# Patient Record
Sex: Male | Born: 1995
Health system: Southern US, Community
[De-identification: ages and names within clinical notes are randomized; demographics above are authoritative.]

## PROBLEM LIST (undated history)

## (undated) DIAGNOSIS — F419 Anxiety disorder, unspecified: Secondary | ICD-10-CM

## (undated) DIAGNOSIS — C4491 Basal cell carcinoma of skin, unspecified: Secondary | ICD-10-CM

## (undated) DIAGNOSIS — F32A Depression, unspecified: Secondary | ICD-10-CM

## (undated) DIAGNOSIS — F329 Major depressive disorder, single episode, unspecified: Secondary | ICD-10-CM

## (undated) DIAGNOSIS — I2699 Other pulmonary embolism without acute cor pulmonale: Secondary | ICD-10-CM

## (undated) HISTORY — DX: Anxiety disorder, unspecified: F41.9

## (undated) HISTORY — DX: Depression, unspecified: F32.A

## (undated) HISTORY — DX: Major depressive disorder, single episode, unspecified: F32.9

## (undated) HISTORY — DX: Other pulmonary embolism without acute cor pulmonale: I26.99

## (undated) HISTORY — DX: Basal cell carcinoma of skin, unspecified: C44.91

---
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
## 2016-08-21 ENCOUNTER — Encounter: Payer: Self-pay | Admitting: Primary Care

## 2016-08-21 ENCOUNTER — Encounter (INDEPENDENT_AMBULATORY_CARE_PROVIDER_SITE_OTHER): Payer: Self-pay

## 2016-08-21 ENCOUNTER — Ambulatory Visit (INDEPENDENT_AMBULATORY_CARE_PROVIDER_SITE_OTHER): Payer: Self-pay | Admitting: Primary Care

## 2016-08-21 DIAGNOSIS — F329 Major depressive disorder, single episode, unspecified: Secondary | ICD-10-CM

## 2016-08-21 DIAGNOSIS — F32A Depression, unspecified: Secondary | ICD-10-CM | POA: Insufficient documentation

## 2016-08-21 DIAGNOSIS — F419 Anxiety disorder, unspecified: Secondary | ICD-10-CM

## 2016-08-21 NOTE — Assessment & Plan Note (Signed)
Currently managed on Citalopram 30 mg. Feels well managed. Continue current regimen. Denies SI/HI.

## 2016-08-21 NOTE — Progress Notes (Signed)
   Subjective:    Patient ID: Mike Rodriguez, male    DOB: 10/09/1995, 21 y.o.   MRN: [id number]  HPI  Mr. Mike Rodriguez is a 21 year old male who presents today to establish care and discuss the problems mentioned below. Will obtain old records.  1) Anxiety and Depression: Diagnosed years ago. Currently managed on citalopram 30 mg for which he's taken for years. Overall he feels well on this medication. He denies SI/HI, panic attacks. He does not need refills of this medication.  Review of Systems  Respiratory: Negative for shortness of breath.   Cardiovascular: Negative for chest pain.  Neurological: Negative for dizziness and headaches.  Psychiatric/Behavioral: Negative for suicidal ideas. The patient is not nervous/anxious.        Past Medical History:  Diagnosis Date  . Anxiety and depression      Social History   Social History  . Marital status: Single    Spouse name: N/A  . Number of children: N/A  . Years of education: N/A   Occupational History  . Not on file.   Social History Main Topics  . Smoking status: Never Smoker  . Smokeless tobacco: Never Used  . Alcohol use Not on file  . Drug use: Unknown  . Sexual activity: Not on file   Other Topics Concern  . Not on file   Social History Narrative   Single.   Currently not employed.   Enjoys playing computer games.     Past Surgical History:  Procedure Laterality Date  . APPENDECTOMY  2006    Family History  Problem Relation Age of Onset  . Hypertension Mother   . Kidney disease Mother   . Arthritis Maternal Grandmother   . Hyperlipidemia Maternal Grandmother   . Hypertension Maternal Grandmother     No Known Allergies  No current outpatient prescriptions on file prior to visit.   No current facility-administered medications on file prior to visit.     BP 122/80 (BP Location: Left Arm, Patient Position: Sitting, Cuff Size: Normal)   Pulse 80   Temp 98.4 F (36.9 C) (Oral)   Ht 5\' 10"  (1.778 m)    Wt 155 lb (70.3 kg)   SpO2 97%   BMI 22.24 kg/m    Objective:   Physical Exam  Constitutional: He is oriented to person, place, and time. He appears well-nourished.  Neck: Neck supple.  Cardiovascular: Normal rate and regular rhythm.   Pulmonary/Chest: Effort normal and breath sounds normal. He has no wheezes. He has no rales.  Neurological: He is alert and oriented to person, place, and time.  Skin: Skin is warm and dry.  Old scarring from acne  Psychiatric: He has a normal mood and affect.          Assessment & Plan:

## 2016-08-21 NOTE — Patient Instructions (Signed)
Please message me when you need a refill of your citalopram medication.  It was a pleasure to see you today!

## 2016-08-21 NOTE — Progress Notes (Signed)
Pre visit review using our clinic review tool, if applicable. No additional management support is needed unless otherwise documented below in the visit note. 

## 2016-11-11 ENCOUNTER — Other Ambulatory Visit: Payer: Self-pay | Admitting: *Deleted

## 2016-11-11 DIAGNOSIS — F32A Depression, unspecified: Secondary | ICD-10-CM

## 2016-11-11 DIAGNOSIS — F419 Anxiety disorder, unspecified: Principal | ICD-10-CM

## 2016-11-11 DIAGNOSIS — F329 Major depressive disorder, single episode, unspecified: Secondary | ICD-10-CM

## 2016-11-11 MED ORDER — CITALOPRAM HYDROBROMIDE 20 MG PO TABS: 30.0000 mg | ORAL_TABLET | Freq: Every day | ORAL | 2 refills | Status: DC

## 2016-11-11 NOTE — Telephone Encounter (Signed)
Patient's dad left a voicemail stating that [facility] requested a refill on Citalopram 5 days ago and has not heard anything back. Mike Rodriguez stated that they are requesting that this be refilled for a 90 day supply.

## 2016-11-11 NOTE — Telephone Encounter (Signed)
Noted- refill sent to pharmacy

## 2016-11-12 NOTE — Telephone Encounter (Signed)
Message left for patient to return my call.  

## 2016-11-13 NOTE — Telephone Encounter (Signed)
Message left for patient to return my call.  

## 2016-11-19 ENCOUNTER — Other Ambulatory Visit: Payer: Self-pay

## 2016-11-19 DIAGNOSIS — F32A Depression, unspecified: Secondary | ICD-10-CM

## 2016-11-19 DIAGNOSIS — F419 Anxiety disorder, unspecified: Principal | ICD-10-CM

## 2016-11-19 DIAGNOSIS — F329 Major depressive disorder, single episode, unspecified: Secondary | ICD-10-CM

## 2016-11-19 MED ORDER — CITALOPRAM HYDROBROMIDE 20 MG PO TABS: 30.0000 mg | ORAL_TABLET | Freq: Every day | ORAL | 2 refills | Status: DC

## 2016-11-19 NOTE — Telephone Encounter (Signed)
[email or web address]; [place]ital[place] to [place] and pt did not get med. [email or web address]. pts father voiced understanding.

## 2017-01-20 ENCOUNTER — Emergency Department
Admission: EM | Admit: 2017-01-20 | Discharge: 2017-01-20 | Disposition: A | Discharge status: Home / Self Care | Payer: BLUE CROSS/BLUE SHIELD | Attending: Emergency Medicine | Admitting: Emergency Medicine

## 2017-01-20 ENCOUNTER — Emergency Department: Payer: BLUE CROSS/BLUE SHIELD

## 2017-01-20 ENCOUNTER — Encounter: Payer: Self-pay | Admitting: Emergency Medicine

## 2017-01-20 DIAGNOSIS — Z79899 Other long term (current) drug therapy: Secondary | ICD-10-CM | POA: Insufficient documentation

## 2017-01-20 DIAGNOSIS — R109 Unspecified abdominal pain: Secondary | ICD-10-CM | POA: Diagnosis not present

## 2017-01-20 DIAGNOSIS — R1032 Left lower quadrant pain: Secondary | ICD-10-CM | POA: Diagnosis not present

## 2017-01-20 DIAGNOSIS — N201 Calculus of ureter: Secondary | ICD-10-CM

## 2017-01-20 LAB — URINALYSIS, COMPLETE (UACMP) WITH MICROSCOPIC
BACTERIA UA: NONE SEEN
BILIRUBIN URINE: NEGATIVE
GLUCOSE, UA: NEGATIVE mg/dL
KETONES UR: NEGATIVE mg/dL
Leukocytes, UA: NEGATIVE
NITRITE: NEGATIVE
PH: 8 (ref 5.0–8.0)
PROTEIN: 100 mg/dL — AB
Specific Gravity, Urine: 1.023 (ref 1.005–1.030)
Squamous Epithelial / LPF: NONE SEEN

## 2017-01-20 LAB — CBC
HCT: 44.1 % (ref 40.0–52.0)
Hemoglobin: 15.2 g/dL (ref 13.0–18.0)
MCH: 32.1 pg (ref 26.0–34.0)
MCHC: 34.6 g/dL (ref 32.0–36.0)
MCV: 93 fL (ref 80.0–100.0)
PLATELETS: 272 10*3/uL (ref 150–440)
RBC: 4.74 MIL/uL (ref 4.40–5.90)
RDW: 12.7 % (ref 11.5–14.5)
WBC: 7.7 10*3/uL (ref 3.8–10.6)

## 2017-01-20 LAB — COMPREHENSIVE METABOLIC PANEL
ALBUMIN: 4.6 g/dL (ref 3.5–5.0)
ALT: 33 U/L (ref 17–63)
ANION GAP: 12 (ref 5–15)
AST: 35 U/L (ref 15–41)
Alkaline Phosphatase: 60 U/L (ref 38–126)
BUN: 14 mg/dL (ref 6–20)
CALCIUM: 9.8 mg/dL (ref 8.9–10.3)
CHLORIDE: 104 mmol/L (ref 101–111)
CO2: 26 mmol/L (ref 22–32)
Creatinine, Ser: 1.26 mg/dL — ABNORMAL HIGH (ref 0.61–1.24)
GFR calc Af Amer: >60 mL/min (ref >60)
GFR calc non Af Amer: >60 mL/min (ref >60)
Glucose, Bld: 115 mg/dL — ABNORMAL HIGH (ref 65–99)
POTASSIUM: 3.2 mmol/L — AB (ref 3.5–5.1)
SODIUM: 142 mmol/L (ref 135–145)
Total Bilirubin: 0.9 mg/dL (ref 0.3–1.2)
Total Protein: 7.8 g/dL (ref 6.5–8.1)

## 2017-01-20 LAB — LIPASE, BLOOD: Lipase: 31 U/L (ref 11–51)

## 2017-01-20 LAB — CHLAMYDIA/NGC RT PCR (ARMC ONLY)
CHLAMYDIA TR: NOT DETECTED
N GONORRHOEAE: NOT DETECTED

## 2017-01-20 MED ORDER — KETOROLAC TROMETHAMINE 30 MG/ML IJ SOLN
30.0000 mg | Freq: Once | INTRAMUSCULAR | Status: AC
  Administered 2017-01-20: 30 mg via INTRAVENOUS

## 2017-01-20 MED ORDER — KETOROLAC TROMETHAMINE 30 MG/ML IJ SOLN
INTRAMUSCULAR | Status: AC
  Filled 2017-01-20: qty 1

## 2017-01-20 MED ORDER — TAMSULOSIN HCL 0.4 MG PO CAPS: 0.4000 mg | ORAL_CAPSULE | Freq: Every day | ORAL | 0 refills | Status: DC

## 2017-01-20 MED ORDER — NAPROXEN 500 MG PO TABS: 500.0000 mg | ORAL_TABLET | Freq: Two times a day (BID) | ORAL | 2 refills | Status: DC

## 2017-01-20 MED ORDER — SODIUM CHLORIDE 0.9 % IV SOLN
1000.0000 mL | Freq: Once | INTRAVENOUS | Status: AC
Start: 2017-01-20 — End: 2017-01-20
  Administered 2017-01-20: 1000 mL via INTRAVENOUS

## 2017-01-20 MED ORDER — ONDANSETRON 4 MG PO TBDP: 4.0000 mg | ORAL_TABLET | Freq: Three times a day (TID) | ORAL | 0 refills | Status: DC | PRN

## 2017-01-20 MED ORDER — TRAMADOL HCL 50 MG PO TABS: 50.0000 mg | ORAL_TABLET | Freq: Four times a day (QID) | ORAL | 0 refills | Status: DC | PRN

## 2017-01-20 NOTE — ED Notes (Signed)
Pt given water 

## 2017-01-20 NOTE — ED Triage Notes (Signed)
C/O "bladder hurting real bad".  Also c/o left blank pain and small amount of blood in urine.  Symptoms started yesterday.  Denies vomitng but symptoms include occasional vomiting.

## 2017-01-20 NOTE — ED Notes (Signed)
Patient transported to CT 

## 2017-01-21 NOTE — ED Provider Notes (Signed)
[facility] Emergency Department Provider Note   ____________________________________________    I have reviewed the triage vital signs and the nursing notes.   HISTORY  Chief Complaint Flank Pain     HPI Mike Rodriguez is a 21 y.o. male who presents with complaints of left-sided flank pain radiating into his groin. He has never had this before. He reports the pain as intermittently sharp and severe and then becomes dull and mild. This started earlier this morning. No history of kidney stones. No penile discharge. No dysuria. No frequency. Positive hematuria. No fevers or chills.   Past Medical History:  Diagnosis Date  . Anxiety and depression     Patient Active Problem List   Diagnosis Date Noted  . Anxiety and depression 08/21/2016    Past Surgical History:  Procedure Laterality Date  . APPENDECTOMY  2006    Prior to Admission medications   Medication Sig Start Date End Date Taking? Authorizing Provider  acetaminophen (TYLENOL) 325 MG tablet Take 650 mg by mouth every 6 (six) hours as needed for headache.    [provider]  citalopram (CELEXA) 20 MG tablet Take 1.5 tablets (30 mg total) by mouth daily. 11/19/16   Doreene Nest, NP  diphenhydrAMINE (BENADRYL) 25 MG tablet Take 25 mg by mouth every 6 (six) hours as needed for allergies.    [provider]  Multiple Vitamin (MULTIVITAMIN) tablet Take 1 tablet by mouth daily.    [provider]  naproxen (NAPROSYN) 500 MG tablet Take 1 tablet (500 mg total) by mouth 2 (two) times daily with a meal. 01/20/17   Jene Every, MD  ondansetron (ZOFRAN ODT) 4 MG disintegrating tablet Take 1 tablet (4 mg total) by mouth every 8 (eight) hours as needed for nausea or vomiting. 01/20/17   Jene Every, MD  tamsulosin (FLOMAX) 0.4 MG CAPS capsule Take 1 capsule (0.4 mg total) by mouth daily. 01/20/17   Jene Every, MD  traMADol (ULTRAM) 50 MG tablet Take 1 tablet (50 mg  total) by mouth every 6 (six) hours as needed. 01/20/17 01/20/18  Jene Every, MD     Allergies Patient has no known allergies.  Family History  Problem Relation Age of Onset  . Hypertension Mother   . Kidney disease Mother   . Arthritis Maternal Grandmother   . Hyperlipidemia Maternal Grandmother   . Hypertension Maternal Grandmother     Social History Social History  Substance Use Topics  . Smoking status: Never Smoker  . Smokeless tobacco: Never Used  . Alcohol use Not on file    Review of Systems  Constitutional: No fever/chills Eyes: No visual changes.  ENT: No sore throat. Cardiovascular: Denies chest pain. Respiratory: Denies shortness of breath. Gastrointestinal: Flank pain as above Genitourinary:As above Musculoskeletal: Negative for back pain. Skin: Negative for rash. Neurological: Negative for headaches    ____________________________________________   PHYSICAL EXAM:  VITAL SIGNS: ED Triage Vitals  Enc Vitals Group     BP 01/20/17 1718 134/85     Pulse Rate 01/20/17 1718 (!) 102     Resp 01/20/17 1718 16     Temp 01/20/17 1718 98.3 F (36.8 C)     Temp Source 01/20/17 1718 Oral     SpO2 01/20/17 1718 100 %     Weight 01/20/17 1717 68 kg (150 lb)     Height 01/20/17 1717 1.778 m ( )     Head Circumference --      Peak Flow --  Pain Score 01/20/17 1716 8     Pain Loc --      Pain Edu? --      Excl. in GC? --     Constitutional: Alert and oriented. No acute distress. Pleasant and interactive Eyes: Conjunctivae are normal.  Head: Atraumatic. Nose: No congestion/rhinnorhea. Mouth/Throat: Mucous membranes are moist.   Cardiovascular: Normal rate, regular rhythm. Peri Jefferson peripheral circulation. Respiratory: Normal respiratory effort.   Gastrointestinal: Soft and nontender. No distention.  No CVA tenderness. Genitourinary: deferred Musculoskeletal:   Warm and well perfused Neurologic:  Normal speech and language. No gross focal  neurologic deficits are appreciated.  Skin:  Skin is warm, dry and intact. No rash noted. Psychiatric: Mood and affect are normal. Speech and behavior are normal.  ____________________________________________   LABS (all labs ordered are listed, but only abnormal results are displayed)  Labs Reviewed  URINALYSIS, COMPLETE (UACMP) WITH MICROSCOPIC - Abnormal; Notable for the following:       Result Value   Color, Urine YELLOW (*)    APPearance TURBID (*)    Hgb urine dipstick LARGE (*)    Protein, ur 100 (*)    All other components within normal limits  COMPREHENSIVE METABOLIC PANEL - Abnormal; Notable for the following:    Potassium 3.2 (*)    Glucose, Bld 115 (*)    Creatinine, Ser 1.26 (*)    All other components within normal limits  CHLAMYDIA/NGC RT PCR ([facility] ONLY)  CBC  LIPASE, BLOOD   ____________________________________________  EKG  None ____________________________________________  RADIOLOGY CT renal stone study demonstrates left-sided ureterolithiasis  ____________________________________________   PROCEDURES  Procedure(s) performed: No    Critical Care performed: No ____________________________________________   INITIAL IMPRESSION / ASSESSMENT AND PLAN / ED COURSE  Pertinent labs & imaging results that were available during my care of the patient were reviewed by me and considered in my medical decision making (see chart for details).  Differential diagnosis includes ureterolithiasis, UTI, STD  CT renal stone study is consistent with kidney stone. Patient had complete resolution of pain after IM Toradol. I'll discharge with pain medications, Zofran and Flomax and have him follow-up with urology.    ____________________________________________   FINAL CLINICAL IMPRESSION(S) / ED DIAGNOSES  Final diagnoses:  Ureterolithiasis      NEW MEDICATIONS STARTED DURING THIS VISIT:  Discharge Medication List as of 01/20/2017  6:22 PM      START taking these medications   Details  naproxen (NAPROSYN) 500 MG tablet Take 1 tablet (500 mg total) by mouth 2 (two) times daily with a meal., Starting Mon 01/20/2017, Print    ondansetron (ZOFRAN ODT) 4 MG disintegrating tablet Take 1 tablet (4 mg total) by mouth every 8 (eight) hours as needed for nausea or vomiting., Starting Mon 01/20/2017, Print    tamsulosin (FLOMAX) 0.4 MG CAPS capsule Take 1 capsule (0.4 mg total) by mouth daily., Starting Mon 01/20/2017, Print    traMADol (ULTRAM) 50 MG tablet Take 1 tablet (50 mg total) by mouth every 6 (six) hours as needed., Starting Mon 01/20/2017, Until Tue 01/20/2018, Print         Note:  This document was prepared using Dragon voice recognition software and may include unintentional dictation errors.    Jene Every, MD 01/21/17 [phone]

## 2017-07-04 ENCOUNTER — Telehealth: Payer: Self-pay | Admitting: Primary Care

## 2017-07-04 DIAGNOSIS — F419 Anxiety disorder, unspecified: Principal | ICD-10-CM

## 2017-07-04 DIAGNOSIS — F32A Depression, unspecified: Secondary | ICD-10-CM

## 2017-07-04 DIAGNOSIS — F329 Major depressive disorder, single episode, unspecified: Secondary | ICD-10-CM

## 2017-07-04 MED ORDER — CITALOPRAM HYDROBROMIDE 20 MG PO TABS: 30.0000 mg | ORAL_TABLET | Freq: Every day | ORAL | 2 refills | Status: DC

## 2017-07-04 NOTE — Telephone Encounter (Signed)
Copied from CRM [id number]. Topic: Quick Communication - Rx Refill/Question >> Jul 04, 2017 12:31 PM Raquel SarnaHayes, Teresa G wrote: citalopram (CELEXA) 20 MG tablet  Pt has no refills - pt is needing refills [facility] - [place] - [address] [place] [id number] Phone: [phone] Fax: [phone]

## 2017-08-18 ENCOUNTER — Telehealth: Payer: Self-pay | Admitting: Primary Care

## 2017-08-18 DIAGNOSIS — F32A Depression, unspecified: Secondary | ICD-10-CM

## 2017-08-18 DIAGNOSIS — F329 Major depressive disorder, single episode, unspecified: Secondary | ICD-10-CM

## 2017-08-18 DIAGNOSIS — F419 Anxiety disorder, unspecified: Principal | ICD-10-CM

## 2017-08-18 NOTE — Telephone Encounter (Signed)
Copied from CRM [id number]. Topic: Quick Communication - Rx Refill/Question >> Aug 18, 2017  3:33 PM Eston Mould B wrote: Medication: citalopram (CELEXA) 20 MG tablet    Has the patient contacted their pharmacy? No   (Agent: If no, request that the patient contact the pharmacy for the refill.)  Preferred Pharmacy (with phone number or street name): [facility] - [place], [place] - [address] [phone] (Phone) [phone] (Fax)      Agent: Please be advised that RX refills may take up to 3 business days. We ask that you follow-up with your pharmacy.

## 2017-08-19 MED ORDER — CITALOPRAM HYDROBROMIDE 20 MG PO TABS: 30.0000 mg | ORAL_TABLET | Freq: Every day | ORAL | 0 refills | Status: DC

## 2017-08-19 NOTE — Telephone Encounter (Signed)
30 day refill of medication refill given until pt seen in office for appt on 07/04/17.   LOV: 08/21/16 Next OV: 09/17/17

## 2017-09-01 ENCOUNTER — Other Ambulatory Visit: Payer: Self-pay | Admitting: Primary Care

## 2017-09-01 DIAGNOSIS — Z0000 Encounter for general adult medical examination without abnormal findings: Secondary | ICD-10-CM

## 2017-09-04 ENCOUNTER — Other Ambulatory Visit: Payer: Self-pay | Admitting: Primary Care

## 2017-09-04 DIAGNOSIS — F329 Major depressive disorder, single episode, unspecified: Secondary | ICD-10-CM

## 2017-09-04 DIAGNOSIS — F32A Depression, unspecified: Secondary | ICD-10-CM

## 2017-09-04 DIAGNOSIS — F419 Anxiety disorder, unspecified: Principal | ICD-10-CM

## 2017-09-04 MED ORDER — CITALOPRAM HYDROBROMIDE 20 MG PO TABS: 30.0000 mg | ORAL_TABLET | Freq: Every day | ORAL | 0 refills | Status: DC

## 2017-09-04 NOTE — Telephone Encounter (Signed)
Copied from CRM [id number]. Topic: Quick Communication - Rx Refill/Question >> Sep 04, 2017  2:51 PM Laural Benes, [place] C wrote: Medication: citalopram (CELEXA) 20 MG tablet  Has the patient contacted their pharmacy? Yes   (Agent: If no, request that the patient contact the pharmacy for the refill.) (Agent: If yes, when and what did the pharmacy advise?)  Preferred Pharmacy (with phone number or street name): [facility] - [place], [place] - [address] [phone] (Phone) [phone] (Fax)      Agent: Please be advised that RX refills may take up to 3 business days. We ask that you follow-up with your pharmacy.

## 2017-09-12 ENCOUNTER — Other Ambulatory Visit (INDEPENDENT_AMBULATORY_CARE_PROVIDER_SITE_OTHER): Payer: BLUE CROSS/BLUE SHIELD

## 2017-09-12 DIAGNOSIS — Z0000 Encounter for general adult medical examination without abnormal findings: Secondary | ICD-10-CM

## 2017-09-12 LAB — COMPREHENSIVE METABOLIC PANEL
ALT: 14 U/L (ref 0–53)
AST: 17 U/L (ref 0–37)
Albumin: 4.7 g/dL (ref 3.5–5.2)
Alkaline Phosphatase: 61 U/L (ref 39–117)
BUN: 15 mg/dL (ref 6–23)
CHLORIDE: 103 meq/L (ref 96–112)
CO2: 30 meq/L (ref 19–32)
Calcium: 9.8 mg/dL (ref 8.4–10.5)
Creatinine, Ser: 1.05 mg/dL (ref 0.40–1.50)
GFR: 92.3 mL/min (ref >60.00)
GLUCOSE: 101 mg/dL — AB (ref 70–99)
POTASSIUM: 3.9 meq/L (ref 3.5–5.1)
Sodium: 141 mEq/L (ref 135–145)
Total Bilirubin: 0.7 mg/dL (ref 0.2–1.2)
Total Protein: 7.7 g/dL (ref 6.0–8.3)

## 2017-09-12 LAB — LIPID PANEL
CHOL/HDL RATIO: 5
Cholesterol: 178 mg/dL (ref 0–200)
HDL: 37.2 mg/dL — AB (ref >39.00)
LDL CALC: 122 mg/dL — AB (ref 0–99)
NonHDL: 140.76
Triglycerides: 92 mg/dL (ref 0.0–149.0)
VLDL: 18.4 mg/dL (ref 0.0–40.0)

## 2017-09-17 ENCOUNTER — Encounter: Payer: Self-pay | Admitting: Primary Care

## 2017-09-17 ENCOUNTER — Ambulatory Visit (INDEPENDENT_AMBULATORY_CARE_PROVIDER_SITE_OTHER): Payer: BLUE CROSS/BLUE SHIELD | Admitting: Primary Care

## 2017-09-17 VITALS — BP 122/82 | HR 102 | Temp 98.2°F | Ht 70.0 in | Wt 155.0 lb

## 2017-09-17 DIAGNOSIS — F419 Anxiety disorder, unspecified: Secondary | ICD-10-CM

## 2017-09-17 DIAGNOSIS — F32A Depression, unspecified: Secondary | ICD-10-CM

## 2017-09-17 DIAGNOSIS — R002 Palpitations: Secondary | ICD-10-CM | POA: Diagnosis not present

## 2017-09-17 DIAGNOSIS — Z0000 Encounter for general adult medical examination without abnormal findings: Secondary | ICD-10-CM | POA: Insufficient documentation

## 2017-09-17 DIAGNOSIS — Z0001 Encounter for general adult medical examination with abnormal findings: Secondary | ICD-10-CM

## 2017-09-17 DIAGNOSIS — F329 Major depressive disorder, single episode, unspecified: Secondary | ICD-10-CM | POA: Diagnosis not present

## 2017-09-17 HISTORY — DX: Palpitations: R00.2

## 2017-09-17 NOTE — Assessment & Plan Note (Signed)
Also with chest pain in various regions of the chest. ECG with NSR with rate of 89. No ST elevation, t-wave inversions, PAC/PVC.  Suspect more MSK, GERD, or anxiety as cause. He refuses lab draw for TSH and d-dimer today, despite recommendations. Doubt PE but given history of PE with symptoms of palpations and chest pain this seems reasonable.   Discussed use of Tums vs Zantac PRN. He will update if symptoms persist.

## 2017-09-17 NOTE — Assessment & Plan Note (Signed)
Declines tetanus vaccination. Poor diet and does not exercise.  Recommended regular exercise, start eating vegetables, fruit, whole grains, increase water.  Exam unremarkable.  Labs unremarkable.  Follow up in 1 year for CPE.

## 2017-09-17 NOTE — Assessment & Plan Note (Signed)
Feels well managed on Citalopram at current dose, although his father reports that he doesn't like to go outside of the house or sometimes his room. He is not employed.   Will continue Citalopram at current dose. Denies SI/HI. He will update if he feels symptoms become worse.

## 2017-09-17 NOTE — Progress Notes (Signed)
Subjective:    Patient ID: Mike Rodriguez, male    DOB: [birth year], 22 y.o.   MRN: [id number]  HPI  [name] is a 22 year old male who presents today for complete physical.  He does notice palpitations and chest pain intermittently.  His pain is located to various regions of his chest at different times, epigastric, left lateral chest, right lower chest. He denies pain with deep inspiration, he does have a history of pulmonary embolism during childhood. He plays a video game using a steering wheel that is heavy, plays this game daily. He can sometimes feel pain when he uses the steering wheel as he has to work hard to turn. He does endorse a lot of belching during some episodes of epigastric pain. He has taken Tums without improvement. He has a history of pectus excavatum.   Immunizations: -Tetanus: Unsure, believes it's over 10 years.  -Influenza: Did not complete last season   Diet: He endorsees a poor diet. Breakfast: Cinnamon toast with syrup Lunch: Sandwich, chips Dinner: Chicken, pasta, pizza, no vegetables Snacks: None Desserts: Cookies, once weekly Beverages: Some water, Soda, little liquid intake  Exercise: He does not exercise Eye exam: No recent exam Dental exam: Completed last year.   Review of Systems  Constitutional: Negative for unexpected weight change.  HENT: Negative for rhinorrhea.   Respiratory: Negative for cough and shortness of breath.   Cardiovascular: Negative for chest pain.  Gastrointestinal: Negative for constipation and diarrhea.  Genitourinary: Negative for difficulty urinating.  Musculoskeletal: Negative for arthralgias and myalgias.  Skin: Negative for rash.  Allergic/Immunologic: Negative for environmental allergies.  Neurological: Negative for dizziness and headaches.  Psychiatric/Behavioral:       Overall feels well managed on Citalopram       Past Medical History:  Diagnosis Date  . Anxiety and depression   . Pulmonary embolism  [facility])      Social History   Socioeconomic History  . Marital status: Single    Spouse name: Not on file  . Number of children: Not on file  . Years of education: Not on file  . Highest education level: Not on file  Occupational History  . Not on file  Social Needs  . Financial resource strain: Not on file  . Food insecurity:    Worry: Not on file    Inability: Not on file  . Transportation needs:    Medical: Not on file    Non-medical: Not on file  Tobacco Use  . Smoking status: Never Smoker  . Smokeless tobacco: Never Used  Substance and Sexual Activity  . Alcohol use: Not on file  . Drug use: Not on file  . Sexual activity: Not on file  Lifestyle  . Physical activity:    Days per week: Not on file    Minutes per session: Not on file  . Stress: Not on file  Relationships  . Social connections:    Talks on phone: Not on file    Gets together: Not on file    Attends religious service: Not on file    Active member of club or organization: Not on file    Attends meetings of clubs or organizations: Not on file    Relationship status: Not on file  . Intimate partner violence:    Fear of current or ex partner: Not on file    Emotionally abused: Not on file    Physically abused: Not on file    Forced sexual activity:  Not on file  Other Topics Concern  . Not on file  Social History Narrative   Single.   Currently not employed.   Enjoys playing computer games.     Past Surgical History:  Procedure Laterality Date  . APPENDECTOMY  2006    Family History  Problem Relation Age of Onset  . Hypertension Mother   . Kidney disease Mother   . Arthritis Maternal Grandmother   . Hyperlipidemia Maternal Grandmother   . Hypertension Maternal Grandmother     No Known Allergies  Current Outpatient Medications on File Prior to Visit  Medication Sig Dispense Refill  . citalopram (CELEXA) 20 MG tablet Take 1.5 tablets (30 mg total) by mouth daily. 45 tablet 0  .  diphenhydrAMINE (BENADRYL) 25 MG tablet Take 25 mg by mouth every 6 (six) hours as needed for allergies.    . Multiple Vitamin (MULTIVITAMIN) tablet Take 1 tablet by mouth daily.     No current facility-administered medications on file prior to visit.     BP 122/82   Pulse (!) 102   Temp 98.2 F (36.8 C) (Oral)   Ht 5\' 10"  (1.778 m)   Wt 155 lb (70.3 kg)   SpO2 98%   BMI 22.24 kg/m    Objective:   Physical Exam  Constitutional: He is oriented to person, place, and time. He appears well-nourished.  HENT:  Mouth/Throat: No oropharyngeal exudate.  Eyes: Pupils are equal, round, and reactive to light. EOM are normal.  Neck: Neck supple. No thyromegaly present.  Cardiovascular: Normal rate and regular rhythm.  Respiratory: Effort normal and breath sounds normal.  GI: Soft. Bowel sounds are normal. There is no tenderness.  Musculoskeletal: Normal range of motion.  Pectus excavatum  Neurological: He is alert and oriented to person, place, and time.  Skin: Skin is warm and dry.  Psychiatric: He has a normal mood and affect.           Assessment & Plan:

## 2017-09-17 NOTE — Patient Instructions (Signed)
Start exercising. You should be getting 150 minutes of moderate intensity exercise weekly.  It's important to improve your diet by reducing consumption of fast food, fried food, processed snack foods, sugary drinks. Increase consumption of fresh vegetables and fruits, whole grains, water.  Ensure you are drinking 64 ounces of water daily.  Please notify me if your depression and anxiety become worse.  Follow up in 1 year or sooner if needed.  It was a pleasure to see you today!   Preventive Care 18-39 Years, Male Preventive care refers to lifestyle choices and visits with your health care provider that can promote health and wellness. What does preventive care include?  A yearly physical exam. This is also called an annual well check.  Dental exams once or twice a year.  Routine eye exams. Ask your health care provider how often you should have your eyes checked.  Personal lifestyle choices, including: ? Daily care of your teeth and gums. ? Regular physical activity. ? Eating a healthy diet. ? Avoiding tobacco and drug use. ? Limiting alcohol use. ? Practicing safe sex. What happens during an annual well check? The services and screenings done by your health care provider during your annual well check will depend on your age, overall health, lifestyle risk factors, and family history of disease. Counseling Your health care provider may ask you questions about your:  Alcohol use.  Tobacco use.  Drug use.  Emotional well-being.  Home and relationship well-being.  Sexual activity.  Eating habits.  Work and work Statistician.  Screening You may have the following tests or measurements:  Height, weight, and BMI.  Blood pressure.  Lipid and cholesterol levels. These may be checked every 5 years starting at age 36.  Diabetes screening. This is done by checking your blood sugar (glucose) after you have not eaten for a while (fasting).  Skin check.  Hepatitis C  blood test.  Hepatitis B blood test.  Sexually transmitted disease (STD) testing.  Discuss your test results, treatment options, and if necessary, the need for more tests with your health care provider. Vaccines Your health care provider may recommend certain vaccines, such as:  Influenza vaccine. This is recommended every year.  Tetanus, diphtheria, and acellular pertussis (Tdap, Td) vaccine. You may need a Td booster every 10 years.  Varicella vaccine. You may need this if you have not been vaccinated.  HPV vaccine. If you are 42 or younger, you may need three doses over 6 months.  Measles, mumps, and rubella (MMR) vaccine. You may need at least one dose of MMR.You may also need a second dose.  Pneumococcal 13-valent conjugate (PCV13) vaccine. You may need this if you have certain conditions and have not been vaccinated.  Pneumococcal polysaccharide (PPSV23) vaccine. You may need one or two doses if you smoke cigarettes or if you have certain conditions.  Meningococcal vaccine. One dose is recommended if you are age 61-21 years and a first-year college student living in a residence hall, or if you have one of several medical conditions. You may also need additional booster doses.  Hepatitis A vaccine. You may need this if you have certain conditions or if you travel or work in places where you may be exposed to hepatitis A.  Hepatitis B vaccine. You may need this if you have certain conditions or if you travel or work in places where you may be exposed to hepatitis B.  Haemophilus influenzae type b (Hib) vaccine. You may need this if you  have certain risk factors.  Talk to your health care provider about which screenings and vaccines you need and how often you need them. This information is not intended to replace advice given to you by your health care provider. Make sure you discuss any questions you have with your health care provider. Document Released: 05/28/2001 Document  Revised: 12/20/2015 Document Reviewed: 01/31/2015 Elsevier Interactive Patient Education  [employer].

## 2017-09-30 ENCOUNTER — Telehealth: Payer: Self-pay | Admitting: Primary Care

## 2017-09-30 DIAGNOSIS — F419 Anxiety disorder, unspecified: Principal | ICD-10-CM

## 2017-09-30 DIAGNOSIS — F32A Depression, unspecified: Secondary | ICD-10-CM

## 2017-09-30 DIAGNOSIS — F329 Major depressive disorder, single episode, unspecified: Secondary | ICD-10-CM

## 2017-09-30 NOTE — Telephone Encounter (Signed)
Copied from CRM [id number]. Topic: Quick Communication - Rx Refill/Question >> Sep 30, 2017 12:01 PM Alexander BergeronBarksdale, Harvey B wrote: Father asked if the pill quantity can be upped so that the medication can last longer, call to advise  Medication: citalopram (CELEXA) 20 MG tablet [[id number]][[id number]]   Has the patient contacted their pharmacy? Yes.   (Agent: If no, request that the patient contact the pharmacy for the refill.) (Agent: If yes, when and what did the pharmacy advise?)  Preferred Pharmacy (with phone number or street name): [facility] [address]  Agent: Please be advised that RX refills may take up to 3 business days. We ask that you follow-up with your pharmacy.

## 2017-10-01 MED ORDER — CITALOPRAM HYDROBROMIDE 20 MG PO TABS: 30.0000 mg | ORAL_TABLET | Freq: Every day | ORAL | 2 refills | Status: DC

## 2017-10-01 NOTE — Telephone Encounter (Signed)
Request for refill on Citalopram 20 mg tablets; requesting refill with larger quantity than 30 day supply.   Last refill 09/04/17; # 45; no refills  Last OV: 09/17/17  PCP Mike Rodriguez  Pharmacy [facility] [address]  Refill placed for 90 day supply, per protocol.   Called and left voice message that 90 day supply will be ordered.

## 2017-12-08 ENCOUNTER — Ambulatory Visit (INDEPENDENT_AMBULATORY_CARE_PROVIDER_SITE_OTHER): Payer: BLUE CROSS/BLUE SHIELD | Admitting: Family Medicine

## 2017-12-08 ENCOUNTER — Encounter: Payer: Self-pay | Admitting: Family Medicine

## 2017-12-08 VITALS — BP 118/78 | HR 88 | Temp 97.6°F | Ht 70.0 in | Wt 154.5 lb

## 2017-12-08 DIAGNOSIS — H6122 Impacted cerumen, left ear: Secondary | ICD-10-CM

## 2017-12-08 DIAGNOSIS — H938X3 Other specified disorders of ear, bilateral: Secondary | ICD-10-CM

## 2017-12-08 NOTE — Patient Instructions (Signed)
Good to see you today  Continue to limit volume through headphones as well as amount of time spent using headphones  If not better in a couple of weeks, please let us know, we can refer you to the ear, nose and throat specialist

## 2017-12-08 NOTE — Progress Notes (Signed)
   Subjective:    Patient ID: Mike Rodriguez, male    DOB: 07/02/1995, 22 y.o.   MRN: [id number]  HPI This is a 22 yo male who presents today with bilateral ear pressure x 1 week. Seems to go back and forth between his ears. Hearing feels a little muffled intermittently. No recent fever, no nasal congestion, some post nasal drainage chronically, had a sore throat one morning recently that resolved. No cough, no headache. Does not use cotton swabs in ear canals. Listens to music/videos for long periods of time through headphones.   Past Medical History:  Diagnosis Date  . Anxiety and depression   . Pulmonary embolism [facility](HCC)    Past Surgical History:  Procedure Laterality Date  . APPENDECTOMY  2006   Family History  Problem Relation Age of Onset  . Hypertension Mother   . Kidney disease Mother   . Arthritis Maternal Grandmother   . Hyperlipidemia Maternal Grandmother   . Hypertension Maternal Grandmother    Social History   Tobacco Use  . Smoking status: Never Smoker  . Smokeless tobacco: Never Used  Substance Use Topics  . Alcohol use: Not on file  . Drug use: Not on file      Review of Systems Per HPI    Objective:   Physical Exam  Constitutional: He is oriented to person, place, and time. He appears well-developed and well-nourished. No distress.  HENT:  Head: Normocephalic and atraumatic.  Right Ear: Hearing, tympanic membrane and external ear normal.  Left Ear: Hearing, tympanic membrane and external ear normal.  Mouth/Throat: Oropharynx is clear and moist.  Gross hearing intact. Weber- no lateralization, Rinne AC>BC. Normal TM visualized, moderate amount cerumen in left canal.   Eyes: Conjunctivae are normal.  Cardiovascular: Normal rate.  Pulmonary/Chest: Effort normal.  Neurological: He is alert and oriented to person, place, and time.  Skin: Skin is warm and dry. He is not diaphoretic.  Psychiatric: He has a normal mood and affect. His behavior is normal.  Judgment and thought content normal.  Vitals reviewed.     BP 118/78 (BP Location: Right Arm, Patient Position: Sitting, Cuff Size: Normal)   Pulse 88   Temp 97.6 F (36.4 C) (Oral)   Ht 5\' 10"  (1.778 m)   Wt 154 lb 8 oz (70.1 kg)   SpO2 97%   BMI 22.17 kg/m   Left ear irrigated by CMA; patient tolerated without adverse effects.     Assessment & Plan:  1. Pressure sensation in both ears - discussed use of headphones, loud noise exposure and encouraged him to decrease amount of time wearing headphones as well as minimizing exposure to loud noises - RTC precautions reviewed  2. Excessive cerumen in ear canal, left - irrigated with good results - discussed ear hygiene    Olean Reeeborah Gessner, FNP-BC   [facility], [facility]  12/08/2017 5:31 PM

## 2018-06-14 ENCOUNTER — Other Ambulatory Visit: Payer: Self-pay | Admitting: Primary Care

## 2018-06-14 DIAGNOSIS — F419 Anxiety disorder, unspecified: Principal | ICD-10-CM

## 2018-06-14 DIAGNOSIS — F329 Major depressive disorder, single episode, unspecified: Secondary | ICD-10-CM

## 2018-06-14 DIAGNOSIS — F32A Depression, unspecified: Secondary | ICD-10-CM

## 2018-09-06 ENCOUNTER — Other Ambulatory Visit: Payer: Self-pay | Admitting: Primary Care

## 2018-09-06 DIAGNOSIS — F419 Anxiety disorder, unspecified: Secondary | ICD-10-CM

## 2018-09-06 DIAGNOSIS — F32A Depression, unspecified: Secondary | ICD-10-CM

## 2018-09-06 DIAGNOSIS — F329 Major depressive disorder, single episode, unspecified: Secondary | ICD-10-CM

## 2018-09-25 ENCOUNTER — Other Ambulatory Visit: Payer: Self-pay | Admitting: Primary Care

## 2018-09-25 DIAGNOSIS — Z0000 Encounter for general adult medical examination without abnormal findings: Secondary | ICD-10-CM

## 2018-09-29 ENCOUNTER — Other Ambulatory Visit (INDEPENDENT_AMBULATORY_CARE_PROVIDER_SITE_OTHER): Payer: BC Managed Care – PPO

## 2018-09-29 ENCOUNTER — Other Ambulatory Visit: Payer: Self-pay

## 2018-09-29 DIAGNOSIS — Z0000 Encounter for general adult medical examination without abnormal findings: Secondary | ICD-10-CM

## 2018-09-29 LAB — LIPID PANEL
Cholesterol: 165 mg/dL (ref 0–200)
HDL: 39 mg/dL — ABNORMAL LOW (ref >39.00)
LDL Cholesterol: 111 mg/dL — ABNORMAL HIGH (ref 0–99)
NonHDL: 126.47
Total CHOL/HDL Ratio: 4
Triglycerides: 77 mg/dL (ref 0.0–149.0)
VLDL: 15.4 mg/dL (ref 0.0–40.0)

## 2018-09-29 LAB — COMPREHENSIVE METABOLIC PANEL
ALT: 11 U/L (ref 0–53)
AST: 16 U/L (ref 0–37)
Albumin: 4.6 g/dL (ref 3.5–5.2)
Alkaline Phosphatase: 62 U/L (ref 39–117)
BUN: 14 mg/dL (ref 6–23)
CO2: 28 mEq/L (ref 19–32)
Calcium: 9.7 mg/dL (ref 8.4–10.5)
Chloride: 103 mEq/L (ref 96–112)
Creatinine, Ser: 0.99 mg/dL (ref 0.40–1.50)
GFR: 92.14 mL/min (ref >60.00)
Glucose, Bld: 100 mg/dL — ABNORMAL HIGH (ref 70–99)
Potassium: 3.7 mEq/L (ref 3.5–5.1)
Sodium: 141 mEq/L (ref 135–145)
Total Bilirubin: 0.6 mg/dL (ref 0.2–1.2)
Total Protein: 7.3 g/dL (ref 6.0–8.3)

## 2018-10-05 ENCOUNTER — Other Ambulatory Visit: Payer: Self-pay | Admitting: Primary Care

## 2018-10-05 DIAGNOSIS — F329 Major depressive disorder, single episode, unspecified: Secondary | ICD-10-CM

## 2018-10-05 DIAGNOSIS — F32A Depression, unspecified: Secondary | ICD-10-CM

## 2018-10-07 ENCOUNTER — Telehealth: Payer: Self-pay | Admitting: Primary Care

## 2018-10-07 ENCOUNTER — Ambulatory Visit (INDEPENDENT_AMBULATORY_CARE_PROVIDER_SITE_OTHER): Payer: BC Managed Care – PPO | Admitting: Primary Care

## 2018-10-07 ENCOUNTER — Encounter: Payer: Self-pay | Admitting: Primary Care

## 2018-10-07 VITALS — BP 116/85 | HR 75 | Temp 98.2°F | Wt 156.0 lb

## 2018-10-07 DIAGNOSIS — F32A Depression, unspecified: Secondary | ICD-10-CM

## 2018-10-07 DIAGNOSIS — Z0001 Encounter for general adult medical examination with abnormal findings: Secondary | ICD-10-CM

## 2018-10-07 DIAGNOSIS — F329 Major depressive disorder, single episode, unspecified: Secondary | ICD-10-CM | POA: Diagnosis not present

## 2018-10-07 DIAGNOSIS — R739 Hyperglycemia, unspecified: Secondary | ICD-10-CM

## 2018-10-07 DIAGNOSIS — Z0000 Encounter for general adult medical examination without abnormal findings: Secondary | ICD-10-CM

## 2018-10-07 DIAGNOSIS — F419 Anxiety disorder, unspecified: Secondary | ICD-10-CM | POA: Diagnosis not present

## 2018-10-07 MED ORDER — CITALOPRAM HYDROBROMIDE 40 MG PO TABS: 40.0000 mg | ORAL_TABLET | Freq: Every day | ORAL | 0 refills | Status: DC

## 2018-10-07 NOTE — Assessment & Plan Note (Signed)
Noted on fasting labs from 2019 and 2020. A1C pending.

## 2018-10-07 NOTE — Progress Notes (Signed)
Subjective:    Patient ID: Mike Rodriguez, male    DOB: 1995-09-02, 23 y.o.   MRN: [id number]  HPI  Virtual Visit via Video Note  I connected with Mike Rodriguez on 10/07/18 at  3:00 PM EDT by a video enabled telemedicine application and verified that I am speaking with the correct person using two identifiers.  Location: Patient: Home Provider: Office   I discussed the limitations of evaluation and management by telemedicine and the availability of in person appointments. The patient expressed understanding and agreed to proceed.  History of Present Illness:  Mike Rodriguez is a 23 year old male who presents today for complete physical. His parents are with him as well who would like to discuss his anxiety and depression.  His father is on with him today who endorses that he continues to want to stay indoors. His family cannot get him to leave the house as he feels anxious around others. He doesn't want to make friends, get a job, go out to [employer] He spends most of his time at home. He feels very content this way, and does feel as though his anxiety and depression has improved overall with citalopram.  His family wants him to increase the dose of his citalopram to see if this helps him to want to get out of the house. He is okay with this.   Immunizations: -Tetanus: Completed in 2013 -Influenza: Due this season  -HPV: Never completed, he agrees to complete  Diet: He endorses a fair diet. He eats sandwich, chips, chicken, pizza, hamburgers, tacos. Cookies several days weekly. He is drinking little liquid during the day, mostly soda. Little water.  Exercise: He does not exercise.   Eye exam: Completed years ago. Dental exam: No recent exam   Observations/Objective:  Alert and oriented. Appears well, not sickly. No distress. Speaking in complete sentences.  Assessment and Plan:  See problem based charting  Follow Up Instructions:  Call the main line to set up your lab  appointment and HPV vaccination series.   You will get your second HPV vaccination 2 months after the first. You will get your third HPV vaccination 6 months after the first.  Start exercising. You should be getting 150 minutes of moderate intensity exercise weekly.  It's important to improve your diet by reducing consumption of fast food, fried food, processed snack foods, sugary drinks. Increase consumption of fresh vegetables and fruits, whole grains, water.  Ensure you are drinking 64 ounces of water daily.  We've increased the dose of your citalopram to 40 mg. Please update me in one month.  It was a pleasure to see you today!    I discussed the assessment and treatment plan with the patient. The patient was provided an opportunity to ask questions and all were answered. The patient agreed with the plan and demonstrated an understanding of the instructions.   The patient was advised to call back or seek an in-person evaluation if the symptoms worsen or if the condition fails to improve as anticipated.     Pleas Koch, NP     Review of Systems  Constitutional: Negative for unexpected weight change.  HENT: Negative for rhinorrhea.   Respiratory: Negative for cough and shortness of breath.   Cardiovascular: Negative for chest pain.  Gastrointestinal: Negative for constipation and diarrhea.  Genitourinary: Negative for difficulty urinating.  Musculoskeletal: Negative for arthralgias and myalgias.  Skin: Negative for rash.  Allergic/Immunologic: Negative for environmental allergies.  Neurological: Negative for  dizziness and headaches.  Psychiatric/Behavioral: The patient is not nervous/anxious.        Past Medical History:  Diagnosis Date  . Anxiety and depression   . Pulmonary embolism [facility](HCC)      Social History   Socioeconomic History  . Marital status: Single    Spouse name: Not on file  . Number of children: Not on file  . Years of education: Not on  file  . Highest education level: Not on file  Occupational History  . Not on file  Social Needs  . Financial resource strain: Not on file  . Food insecurity    Worry: Not on file    Inability: Not on file  . Transportation needs    Medical: Not on file    Non-medical: Not on file  Tobacco Use  . Smoking status: Never Smoker  . Smokeless tobacco: Never Used  Substance and Sexual Activity  . Alcohol use: Not on file  . Drug use: Not on file  . Sexual activity: Not on file  Lifestyle  . Physical activity    Days per week: Not on file    Minutes per session: Not on file  . Stress: Not on file  Relationships  . Social Musicianconnections    Talks on phone: Not on file    Gets together: Not on file    Attends religious service: Not on file    Active member of club or organization: Not on file    Attends meetings of clubs or organizations: Not on file    Relationship status: Not on file  . Intimate partner violence    Fear of current or ex partner: Not on file    Emotionally abused: Not on file    Physically abused: Not on file    Forced sexual activity: Not on file  Other Topics Concern  . Not on file  Social History Narrative   Single.   Currently not employed.   Enjoys playing computer games.     Past Surgical History:  Procedure Laterality Date  . APPENDECTOMY  2006    Family History  Problem Relation Age of Onset  . Hypertension Mother   . Kidney disease Mother   . Arthritis Maternal Grandmother   . Hyperlipidemia Maternal Grandmother   . Hypertension Maternal Grandmother     No Known Allergies  Current Outpatient Medications on File Prior to Visit  Medication Sig Dispense Refill  . citalopram (CELEXA) 20 MG tablet Take 1 and 1/2 tablets daily by mouth NEED APPOINTMENT FOR ANY MORE REFILLS IN JUNE 135 tablet 0  . diphenhydrAMINE (BENADRYL) 25 MG tablet Take 25 mg by mouth every 6 (six) hours as needed for allergies.    . Multiple Vitamin (MULTIVITAMIN) tablet  Take 1 tablet by mouth daily.     No current facility-administered medications on file prior to visit.     BP 116/85   Pulse 75   Temp 98.2 F (36.8 C)   Wt 156 lb (70.8 kg)   SpO2 98%   BMI 22.38 kg/m    Objective:   Physical Exam  Constitutional: He is oriented to person, place, and time. He appears well-nourished.  Respiratory: Effort normal.  Musculoskeletal: Normal range of motion.  Neurological: He is alert and oriented to person, place, and time.  Psychiatric: He has a normal mood and affect.           Assessment & Plan:

## 2018-10-07 NOTE — Telephone Encounter (Signed)
Patient needs to be set up for HPV vaccinations, he will need three at 0, 2, and 6 months increments.  He needs POC A1C as well. Please schedule both.

## 2018-10-07 NOTE — Patient Instructions (Addendum)
Call the main line to set up your lab appointment and HPV vaccination series.   You will get your second HPV vaccination 2 months after the first. You will get your third HPV vaccination 6 months after the first.  Start exercising. You should be getting 150 minutes of moderate intensity exercise weekly.  It's important to improve your diet by reducing consumption of fast food, fried food, processed snack foods, sugary drinks. Increase consumption of fresh vegetables and fruits, whole grains, water.  Ensure you are drinking 64 ounces of water daily.  We've increased the dose of your citalopram to 40 mg. Please update me in one month.  It was a pleasure to see you today!

## 2018-10-07 NOTE — Assessment & Plan Note (Signed)
Overall seem to be under good control. He does have aspects of social anxiety disorder given his desire to stay home and away from public spaces.  Discussed that a 10 mg increase in citalopram may not significantly reduce public anxiety or help him to want to be social, but that it may help with breakthrough anxiety.   I recommended psychiatry evaluation for further insight into his behavior, the patient kindly declined. We will increase his citalopram to 40 mg as he's already taking 30 mg. He will update in one month. Denies SI/HI.

## 2018-10-07 NOTE — Telephone Encounter (Signed)
Patient's dad called to schedule patient HPV vaccine. He stated that he believed the patient is needing some type of blood test done but he wasn't sure.  He wanted me to verify if patient is just needing a nurse visit for the vaccine and is this does curbside?    Thanks!

## 2018-10-07 NOTE — Assessment & Plan Note (Signed)
Tetanus UTD, has never completed HPV series. He will get this set up.  Encouraged a healthier diet with regular exercise. Virtual exam unremarkable. Labs reviewed. Follow up in 1 year for CPE.

## 2018-10-14 ENCOUNTER — Other Ambulatory Visit: Payer: BC Managed Care – PPO

## 2018-10-14 ENCOUNTER — Ambulatory Visit (INDEPENDENT_AMBULATORY_CARE_PROVIDER_SITE_OTHER): Payer: BC Managed Care – PPO

## 2018-10-14 DIAGNOSIS — Z23 Encounter for immunization: Secondary | ICD-10-CM

## 2018-11-05 ENCOUNTER — Telehealth: Payer: Self-pay | Admitting: Primary Care

## 2018-11-05 NOTE — Telephone Encounter (Signed)
-----   Message from Pleas Koch, NP sent at 10/07/2018  8:19 PM EDT ----- Regarding: HPV Will you please check on patient? How's he doing since we increased the dose of his citalopram to 40 mg?

## 2018-11-06 NOTE — Telephone Encounter (Signed)
Message left for patient to return my call.  

## 2018-11-09 NOTE — Telephone Encounter (Signed)
Message left for patient to return my call.  

## 2018-11-11 NOTE — Telephone Encounter (Signed)
Message left for patient to return my call.  

## 2018-12-15 ENCOUNTER — Ambulatory Visit (INDEPENDENT_AMBULATORY_CARE_PROVIDER_SITE_OTHER): Payer: Self-pay

## 2018-12-15 DIAGNOSIS — Z23 Encounter for immunization: Secondary | ICD-10-CM

## 2019-01-01 ENCOUNTER — Other Ambulatory Visit: Payer: Self-pay | Admitting: Primary Care

## 2019-01-01 DIAGNOSIS — F32A Depression, unspecified: Secondary | ICD-10-CM

## 2019-01-01 DIAGNOSIS — F419 Anxiety disorder, unspecified: Secondary | ICD-10-CM

## 2019-01-01 DIAGNOSIS — F329 Major depressive disorder, single episode, unspecified: Secondary | ICD-10-CM

## 2019-02-03 ENCOUNTER — Other Ambulatory Visit: Payer: Self-pay | Admitting: Primary Care

## 2019-02-03 DIAGNOSIS — F32A Depression, unspecified: Secondary | ICD-10-CM

## 2019-02-03 DIAGNOSIS — F329 Major depressive disorder, single episode, unspecified: Secondary | ICD-10-CM

## 2019-02-03 DIAGNOSIS — F419 Anxiety disorder, unspecified: Secondary | ICD-10-CM

## 2019-04-20 ENCOUNTER — Ambulatory Visit (INDEPENDENT_AMBULATORY_CARE_PROVIDER_SITE_OTHER): Payer: 59

## 2019-04-20 ENCOUNTER — Other Ambulatory Visit: Payer: Self-pay

## 2019-04-20 DIAGNOSIS — Z23 Encounter for immunization: Secondary | ICD-10-CM

## 2019-05-12 IMAGING — CT CT RENAL STONE PROTOCOL
3 of 4 series · 10 of 46 positions shown, 17 images · non-contrast
Comparison: None.

CLINICAL DATA: Bladder pain.  Left flank pain.

EXAM:
CT ABDOMEN AND PELVIS WITHOUT CONTRAST
TECHNIQUE: Multidetector CT imaging of the abdomen and pelvis was performed
following the standard protocol without IV contrast.

[Series 4: lung bases · axial · 0.71mm/px · z∈[-217,-117]mm · 6 of 29 slices shown, 11 images]
[im 5/29  soft-tissue]
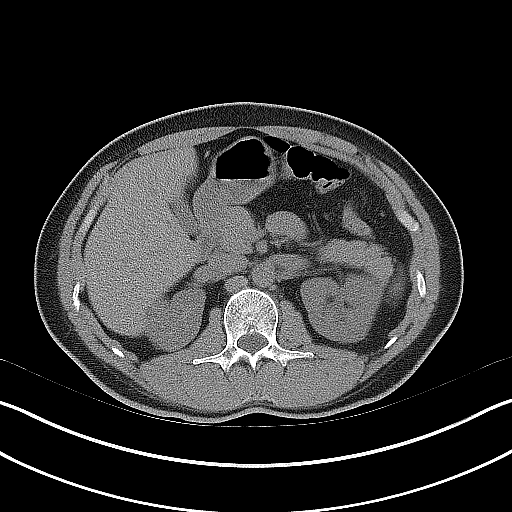
[im 5/29  bone]
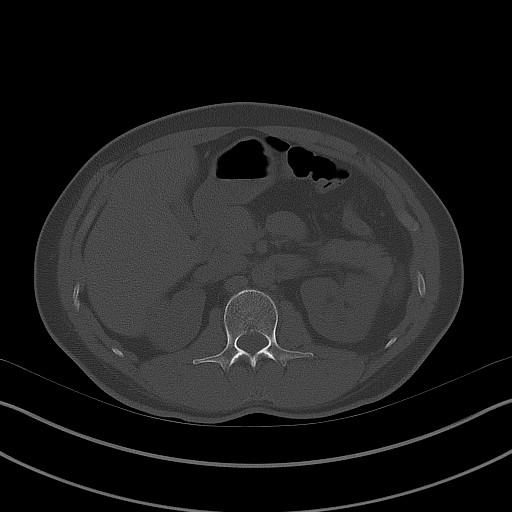
[im 9/29  soft-tissue]
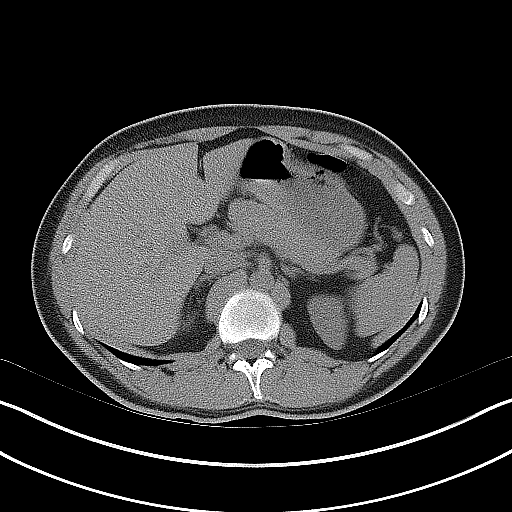
[im 13/29  soft-tissue]
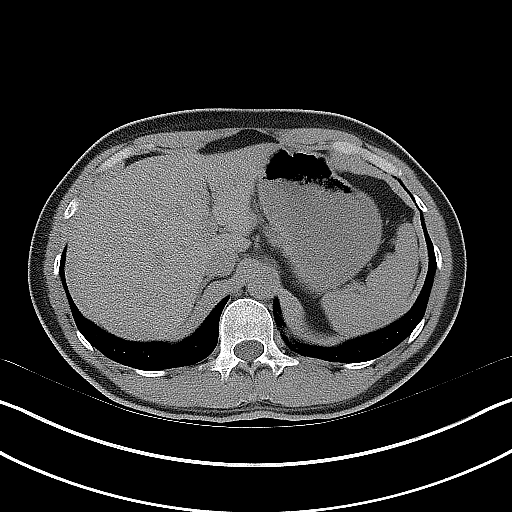
[im 13/29  lung]
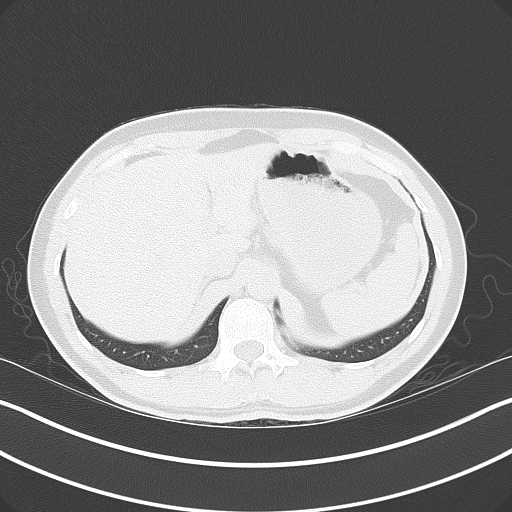
[im 17/29  soft-tissue]
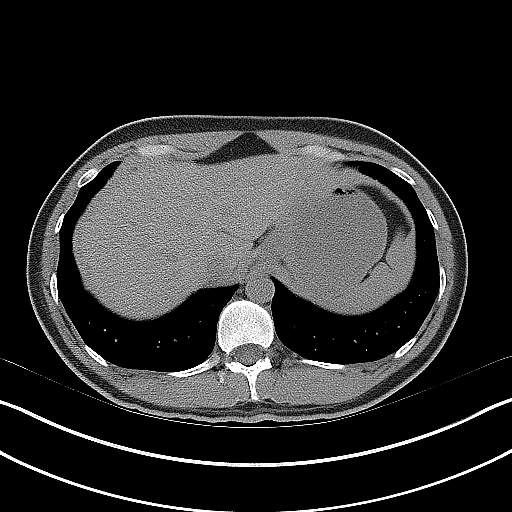
[im 17/29  lung]
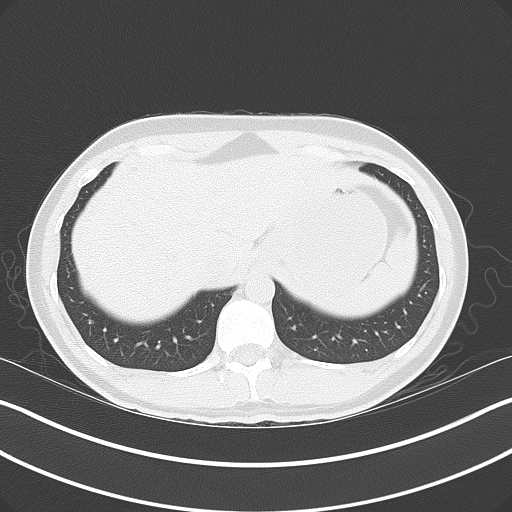
[im 21/29  soft-tissue]
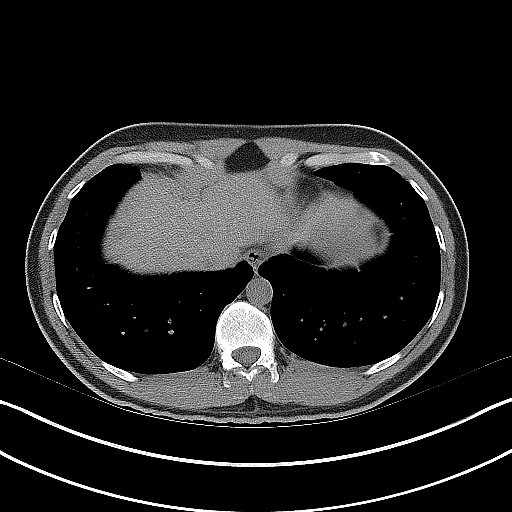
[im 21/29  lung]
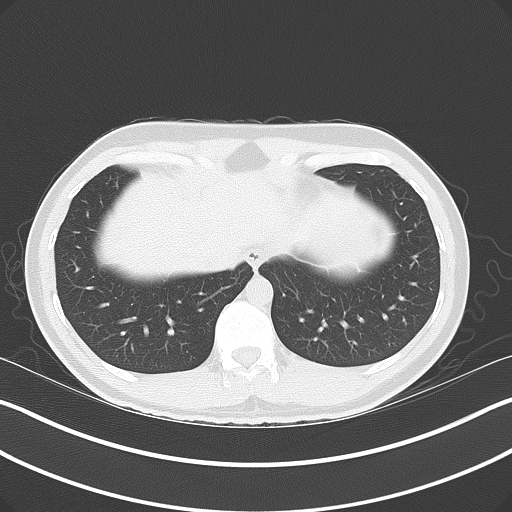
[im 25/29  soft-tissue]
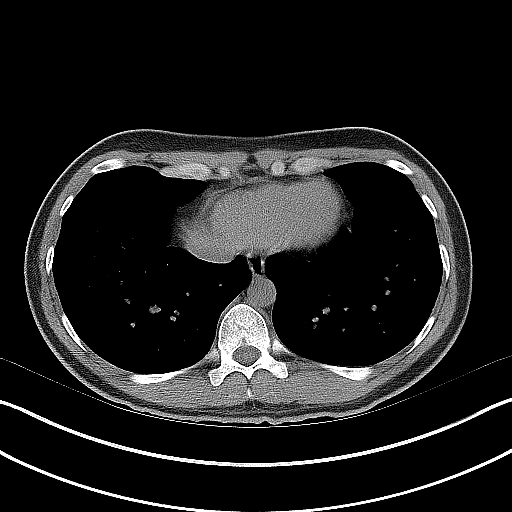
[im 25/29  lung]
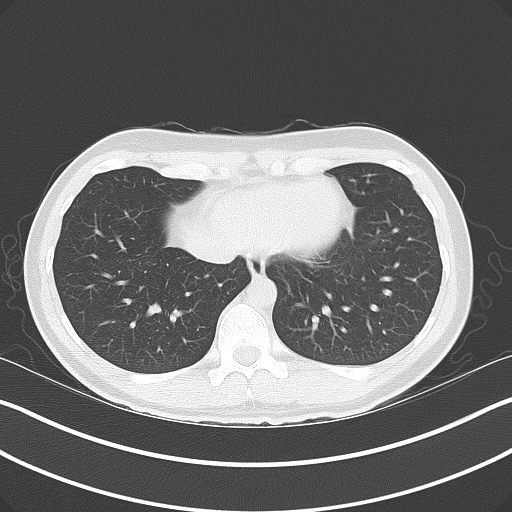

[Series 5: coronal · coronal · 0.67mm/px · 3 of 108 slices shown, 4 images]
[im 36/108  soft-tissue]
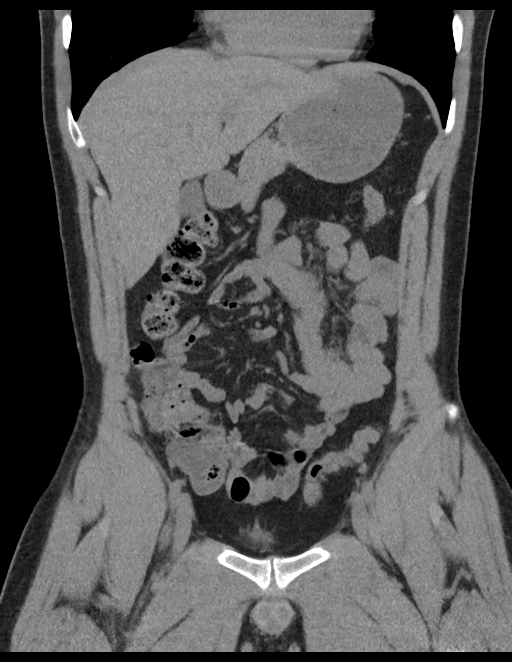
[im 48/108  soft-tissue]
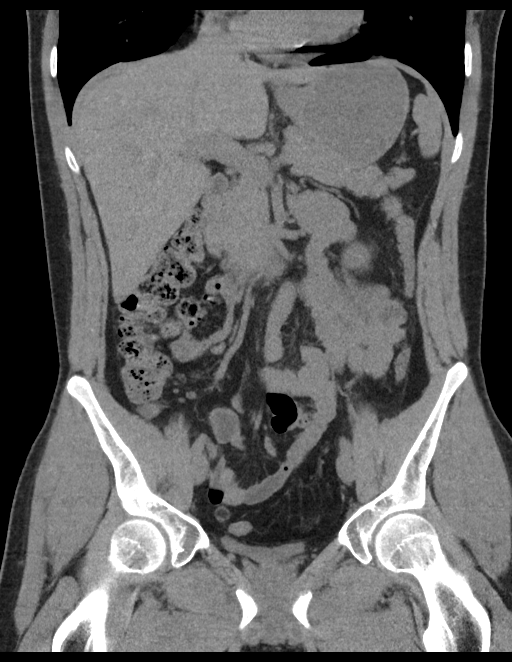
[im 48/108  bone]
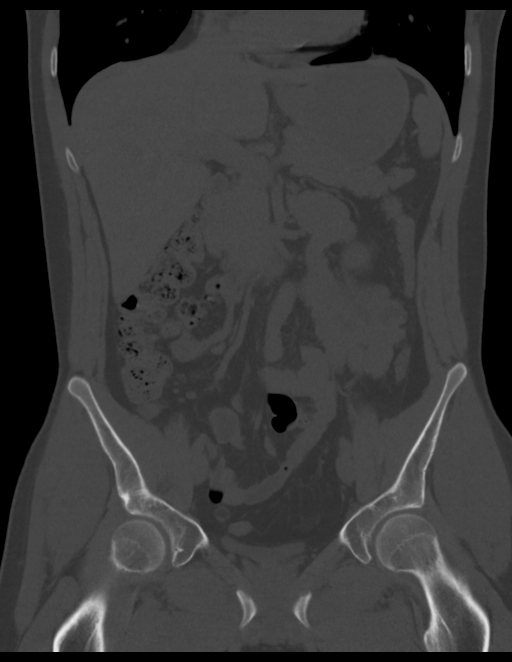
[im 60/108  soft-tissue]
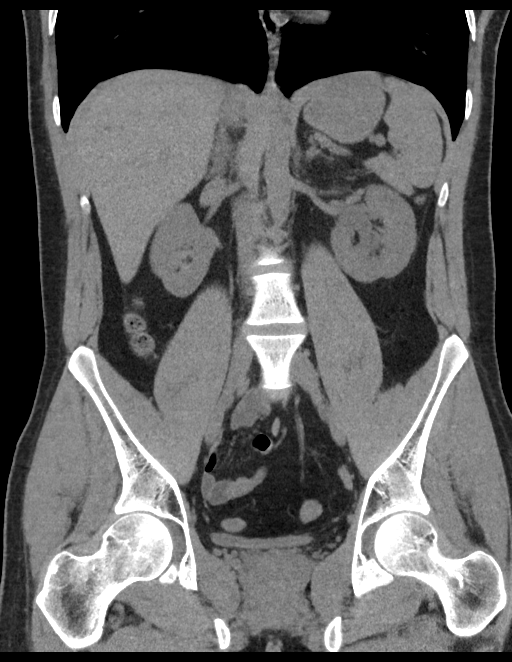

[Series 6: sagittal · sagittal · 0.55mm/px · 1 of 151 slices shown, 2 images]
[im 51/151  soft-tissue]
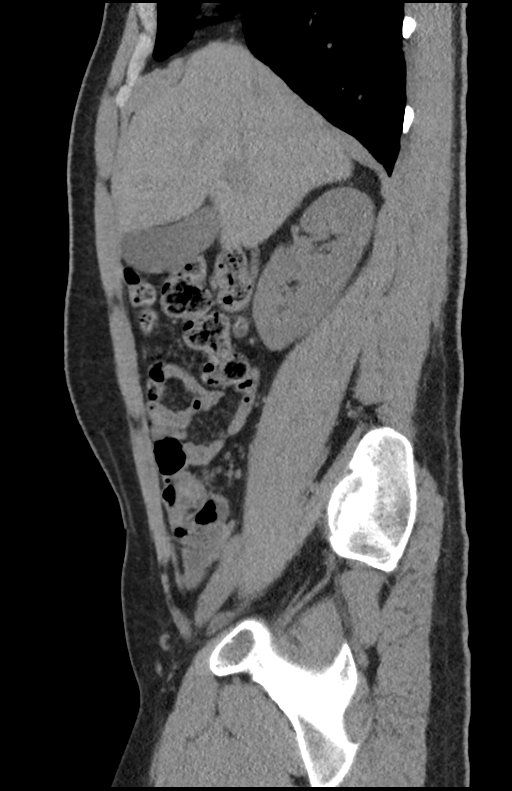
[im 51/151  bone]
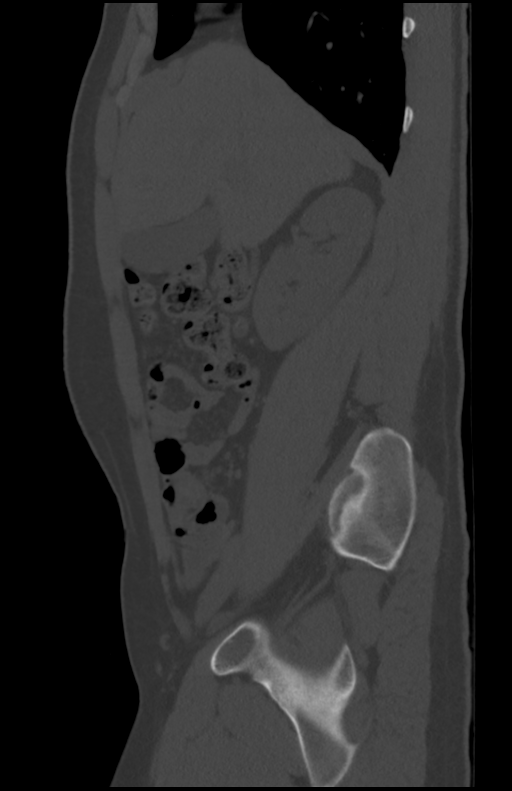

[10 of 46 positions shown; findings below may reference images not displayed]

FINDINGS: Lower chest: No acute abnormality.

Hepatobiliary: No focal liver abnormality is seen. No gallstones,
gallbladder wall thickening, or biliary dilatation.

Pancreas: Unremarkable. No pancreatic ductal dilatation or
surrounding inflammatory changes.

Spleen: Normal in size without focal abnormality.

Adrenals/Urinary Tract: Adrenal glands are unremarkable. Kidneys are
without renal calculi. 1 cm hyper attenuated lesion within the lower
pole of the left kidney likely represents a hemorrhagic cyst. There
is a 4 mm calculus within the distal most left ureter, slightly
upstream to the vesicoureteral junction. There is an associated
moderate left hydroureter and mild left hydronephrosis. Bladder is
unremarkable.

Stomach/Bowel: Stomach is within normal limits. Appendix appears
normal. No evidence of bowel wall thickening, distention, or
inflammatory changes.

Vascular/Lymphatic: No significant vascular findings are present. No
enlarged abdominal or pelvic lymph nodes.

Reproductive: Prostate is unremarkable.

Other: No abdominal wall hernia or abnormality. No abdominopelvic
ascites.

Musculoskeletal: No acute or significant osseous findings.
IMPRESSION: 4 mm distal left ureteral calculus, causing moderate left
hydroureter and mild left hydronephrosis.

1 cm hyper attenuated lesion within the lower pole of the left
kidney, may represent a hemorrhagic cyst.

Normal appearance of the right kidney and urinary bladder.

## 2019-06-02 ENCOUNTER — Telehealth: Payer: Self-pay | Admitting: Primary Care

## 2019-06-02 DIAGNOSIS — F329 Major depressive disorder, single episode, unspecified: Secondary | ICD-10-CM

## 2019-06-02 DIAGNOSIS — F32A Depression, unspecified: Secondary | ICD-10-CM

## 2019-06-02 NOTE — Telephone Encounter (Signed)
Mike Rodriguez called needing to get refill on  Citalopram  Pt has about 6 pills left [facility]

## 2019-06-04 MED ORDER — CITALOPRAM HYDROBROMIDE 40 MG PO TABS: 40.0000 mg | ORAL_TABLET | Freq: Every day | ORAL | 1 refills | Status: DC

## 2019-06-04 NOTE — Telephone Encounter (Signed)
Refill as requested 

## 2019-09-21 ENCOUNTER — Other Ambulatory Visit: Payer: 59

## 2019-09-23 ENCOUNTER — Other Ambulatory Visit: Payer: Self-pay | Admitting: Primary Care

## 2019-09-23 DIAGNOSIS — Z0000 Encounter for general adult medical examination without abnormal findings: Secondary | ICD-10-CM

## 2019-09-23 DIAGNOSIS — Z1159 Encounter for screening for other viral diseases: Secondary | ICD-10-CM

## 2019-09-23 DIAGNOSIS — Z114 Encounter for screening for human immunodeficiency virus [HIV]: Secondary | ICD-10-CM

## 2019-09-28 ENCOUNTER — Encounter: Payer: 59 | Admitting: Primary Care

## 2019-10-05 ENCOUNTER — Other Ambulatory Visit: Payer: Self-pay

## 2019-10-06 ENCOUNTER — Other Ambulatory Visit (INDEPENDENT_AMBULATORY_CARE_PROVIDER_SITE_OTHER): Payer: 59

## 2019-10-06 DIAGNOSIS — Z0000 Encounter for general adult medical examination without abnormal findings: Secondary | ICD-10-CM

## 2019-10-06 DIAGNOSIS — Z1159 Encounter for screening for other viral diseases: Secondary | ICD-10-CM

## 2019-10-06 DIAGNOSIS — Z114 Encounter for screening for human immunodeficiency virus [HIV]: Secondary | ICD-10-CM

## 2019-10-06 LAB — COMPREHENSIVE METABOLIC PANEL
ALT: 16 U/L (ref 0–53)
AST: 21 U/L (ref 0–37)
Albumin: 4.8 g/dL (ref 3.5–5.2)
Alkaline Phosphatase: 59 U/L (ref 39–117)
BUN: 16 mg/dL (ref 6–23)
CO2: 31 mEq/L (ref 19–32)
Calcium: 9.8 mg/dL (ref 8.4–10.5)
Chloride: 102 mEq/L (ref 96–112)
Creatinine, Ser: 1.02 mg/dL (ref 0.40–1.50)
GFR: 88.3 mL/min (ref >60.00)
Glucose, Bld: 100 mg/dL — ABNORMAL HIGH (ref 70–99)
Potassium: 3.8 mEq/L (ref 3.5–5.1)
Sodium: 138 mEq/L (ref 135–145)
Total Bilirubin: 0.6 mg/dL (ref 0.2–1.2)
Total Protein: 7.3 g/dL (ref 6.0–8.3)

## 2019-10-06 LAB — LIPID PANEL
Cholesterol: 186 mg/dL (ref 0–200)
HDL: 41.6 mg/dL (ref >39.00)
LDL Cholesterol: 131 mg/dL — ABNORMAL HIGH (ref 0–99)
NonHDL: 144.19
Total CHOL/HDL Ratio: 4
Triglycerides: 67 mg/dL (ref 0.0–149.0)
VLDL: 13.4 mg/dL (ref 0.0–40.0)

## 2019-10-06 LAB — CBC
HCT: 45.2 % (ref 39.0–52.0)
Hemoglobin: 15.6 g/dL (ref 13.0–17.0)
MCHC: 34.5 g/dL (ref 30.0–36.0)
MCV: 94.3 fl (ref 78.0–100.0)
Platelets: 232 10*3/uL (ref 150.0–400.0)
RBC: 4.8 Mil/uL (ref 4.22–5.81)
RDW: 12.5 % (ref 11.5–15.5)
WBC: 5.6 10*3/uL (ref 4.0–10.5)

## 2019-10-07 LAB — HIV ANTIBODY (ROUTINE TESTING W REFLEX): HIV 1&2 Ab, 4th Generation: NONREACTIVE

## 2019-10-07 LAB — HEPATITIS C ANTIBODY
Hepatitis C Ab: NONREACTIVE
SIGNAL TO CUT-OFF: 0.02 (ref <1.00)

## 2019-10-13 ENCOUNTER — Ambulatory Visit (INDEPENDENT_AMBULATORY_CARE_PROVIDER_SITE_OTHER): Payer: 59 | Admitting: Primary Care

## 2019-10-13 ENCOUNTER — Encounter: Payer: Self-pay | Admitting: Primary Care

## 2019-10-13 ENCOUNTER — Other Ambulatory Visit: Payer: Self-pay

## 2019-10-13 VITALS — BP 106/72 | HR 91 | Temp 96.1°F | Ht 70.0 in | Wt 153.8 lb

## 2019-10-13 DIAGNOSIS — F329 Major depressive disorder, single episode, unspecified: Secondary | ICD-10-CM

## 2019-10-13 DIAGNOSIS — Z0000 Encounter for general adult medical examination without abnormal findings: Secondary | ICD-10-CM | POA: Diagnosis not present

## 2019-10-13 DIAGNOSIS — F419 Anxiety disorder, unspecified: Secondary | ICD-10-CM | POA: Diagnosis not present

## 2019-10-13 DIAGNOSIS — F32A Depression, unspecified: Secondary | ICD-10-CM

## 2019-10-13 MED ORDER — CITALOPRAM HYDROBROMIDE 40 MG PO TABS: 40.0000 mg | ORAL_TABLET | Freq: Every day | ORAL | 3 refills | Status: DC

## 2019-10-13 NOTE — Patient Instructions (Signed)
Start exercising. You should be getting 150 minutes of moderate intensity exercise weekly.  It's important to improve your diet by reducing consumption of fast food, fried food, processed snack foods, sugary drinks. Increase consumption of fresh vegetables and fruits, whole grains, water.  Ensure you are drinking 64 ounces of water daily.  It was a pleasure to see you today!   Preventive Care 16-26 Years Old, Male Preventive care refers to lifestyle choices and visits with your health care provider that can promote health and wellness. This includes:  A yearly physical exam. This is also called an annual well check.  Regular dental and eye exams.  Immunizations.  Screening for certain conditions.  Healthy lifestyle choices, such as eating a healthy diet, getting regular exercise, not using drugs or products that contain nicotine and tobacco, and limiting alcohol use. What can I expect for my preventive care visit? Physical exam Your health care provider will check:  Height and weight. These may be used to calculate body mass index (BMI), which is a measurement that tells if you are at a healthy weight.  Heart rate and blood pressure.  Your skin for abnormal spots. Counseling Your health care provider may ask you questions about:  Alcohol, tobacco, and drug use.  Emotional well-being.  Home and relationship well-being.  Sexual activity.  Eating habits.  Work and work Statistician. What immunizations do I need?  Influenza (flu) vaccine  This is recommended every year. Tetanus, diphtheria, and pertussis (Tdap) vaccine  You may need a Td booster every 10 years. Varicella (chickenpox) vaccine  You may need this vaccine if you have not already been vaccinated. Human papillomavirus (HPV) vaccine  If recommended by your health care provider, you may need three doses over 6 months. Measles, mumps, and rubella (MMR) vaccine  You may need at least one dose of MMR. You  may also need a second dose. Meningococcal conjugate (MenACWY) vaccine  One dose is recommended if you are 71-28 years old and a Market researcher living in a residence hall, or if you have one of several medical conditions. You may also need additional booster doses. Pneumococcal conjugate (PCV13) vaccine  You may need this if you have certain conditions and were not previously vaccinated. Pneumococcal polysaccharide (PPSV23) vaccine  You may need one or two doses if you smoke cigarettes or if you have certain conditions. Hepatitis A vaccine  You may need this if you have certain conditions or if you travel or work in places where you may be exposed to hepatitis A. Hepatitis B vaccine  You may need this if you have certain conditions or if you travel or work in places where you may be exposed to hepatitis B. Haemophilus influenzae type b (Hib) vaccine  You may need this if you have certain risk factors. You may receive vaccines as individual doses or as more than one vaccine together in one shot (combination vaccines). Talk with your health care provider about the risks and benefits of combination vaccines. What tests do I need? Blood tests  Lipid and cholesterol levels. These may be checked every 5 years starting at age 64.  Hepatitis C test.  Hepatitis B test. Screening   Diabetes screening. This is done by checking your blood sugar (glucose) after you have not eaten for a while (fasting).  Sexually transmitted disease (STD) testing. Talk with your health care provider about your test results, treatment options, and if necessary, the need for more tests. Follow these instructions at  home: Eating and drinking   Eat a diet that includes fresh fruits and vegetables, whole grains, lean protein, and low-fat dairy products.  Take vitamin and mineral supplements as recommended by your health care provider.  Do not drink alcohol if your health care provider tells you  not to drink.  If you drink alcohol: ? Limit how much you have to 0-2 drinks a day. ? Be aware of how much alcohol is in your drink. In the [place] one drink equals one 12 oz bottle of beer (355 mL), one 5 oz glass of wine (148 mL), or one 1 oz glass of hard liquor (44 mL). Lifestyle  Take daily care of your teeth and gums.  Stay active. Exercise for at least 30 minutes on 5 or more days each week.  Do not use any products that contain nicotine or tobacco, such as cigarettes, e-cigarettes, and chewing tobacco. If you need help quitting, ask your health care provider.  If you are sexually active, practice safe sex. Use a condom or other form of protection to prevent STIs (sexually transmitted infections). What's next?  Go to your health care provider once a year for a well check visit.  Ask your health care provider how often you should have your eyes and teeth checked.  Stay up to date on all vaccines. This information is not intended to replace advice given to you by your health care provider. Make sure you discuss any questions you have with your health care provider. Document Revised: 03/26/2018 Document Reviewed: 03/26/2018 Elsevier Patient Education  2020 [employer].

## 2019-10-13 NOTE — Assessment & Plan Note (Signed)
Immunizations UTD. Encouraged a healthy diet, regular exercise.  Exam today unremarkable.  Labs reviewed.

## 2019-10-13 NOTE — Assessment & Plan Note (Signed)
Doing well on citalopram, continue same. Denies SI/HI. 

## 2019-10-13 NOTE — Progress Notes (Signed)
Subjective:    Patient ID: Mike Rodriguez, male    DOB: 1996-01-15, 24 y.o.   MRN: [id number]  HPI  This visit occurred during the SARS-CoV-2 public health emergency.  Safety protocols were in place, including screening questions prior to the visit, additional usage of staff PPE, and extensive cleaning of exam room while observing appropriate contact time as indicated for disinfecting solutions.   Mike Rodriguez is a 24 year old male who presents today for complete physical.  Immunizations: -Tetanus: Completed in 2013 -Influenza: Due this season  -Covid-19: Declines  -HPV: Completed series  Diet: He endorses a healthy diet.  Exercise: He is exercising most days of the week.   Eye exam: No recent exam Dental exam: No recent exam  BP Readings from Last 3 Encounters:  10/13/19 106/72  10/07/18 116/85  12/08/17 118/78     Review of Systems  Constitutional: Negative for unexpected weight change.  HENT: Negative for rhinorrhea.   Respiratory: Negative for cough and shortness of breath.   Cardiovascular: Negative for chest pain.  Gastrointestinal: Negative for constipation and diarrhea.  Genitourinary: Negative for difficulty urinating.  Musculoskeletal: Negative for arthralgias and myalgias.  Skin: Negative for rash.  Allergic/Immunologic: Negative for environmental allergies.  Neurological: Negative for dizziness, numbness and headaches.  Psychiatric/Behavioral: The patient is not nervous/anxious.        Past Medical History:  Diagnosis Date  . Anxiety and depression   . Pulmonary embolism [facility])      Social History   Socioeconomic History  . Marital status: Single    Spouse name: Not on file  . Number of children: Not on file  . Years of education: Not on file  . Highest education level: Not on file  Occupational History  . Not on file  Tobacco Use  . Smoking status: Never Smoker  . Smokeless tobacco: Never Used  Vaping Use  . Vaping Use: Former  Substance and  Sexual Activity  . Alcohol use: Not on file  . Drug use: Not on file  . Sexual activity: Not on file  Other Topics Concern  . Not on file  Social History Narrative   Single.   Currently not employed.   Enjoys playing computer games.    Social Determinants of Health   Financial Resource Strain:   . Difficulty of Paying Living Expenses:   Food Insecurity:   . Worried About Programme researcher, broadcasting/film/video in the Last Year:   . Barista in the Last Year:   Transportation Needs:   . Freight forwarder (Medical):   Marland Kitchen Lack of Transportation (Non-Medical):   Physical Activity:   . Days of Exercise per Week:   . Minutes of Exercise per Session:   Stress:   . Feeling of Stress :   Social Connections:   . Frequency of Communication with Friends and Family:   . Frequency of Social Gatherings with Friends and Family:   . Attends Religious Services:   . Active Member of Clubs or Organizations:   . Attends Banker Meetings:   Marland Kitchen Marital Status:   Intimate Partner Violence:   . Fear of Current or Ex-Partner:   . Emotionally Abused:   Marland Kitchen Physically Abused:   . Sexually Abused:     Past Surgical History:  Procedure Laterality Date  . APPENDECTOMY  2006    Family History  Problem Relation Age of Onset  . Hypertension Mother   . Kidney disease Mother   .  Arthritis Maternal Grandmother   . Hyperlipidemia Maternal Grandmother   . Hypertension Maternal Grandmother     No Known Allergies  Current Outpatient Medications on File Prior to Visit  Medication Sig Dispense Refill  . citalopram (CELEXA) 40 MG tablet Take 1 tablet (40 mg total) by mouth daily. For anxiety and depression 90 tablet 1  . diphenhydrAMINE (BENADRYL) 25 MG tablet Take 25 mg by mouth every 6 (six) hours as needed for allergies.    . Multiple Vitamin (MULTIVITAMIN) tablet Take 1 tablet by mouth daily.     No current facility-administered medications on file prior to visit.    BP 106/72   Pulse 91    Temp (!) 96.1 F (35.6 C) (Temporal)   Ht 5\' 10"  (1.778 m)   Wt 153 lb 12 oz (69.7 kg)   SpO2 97%   BMI 22.06 kg/m    Objective:   Physical Exam HENT:     Right Ear: Tympanic membrane and ear canal normal.     Left Ear: Tympanic membrane and ear canal normal.  Eyes:     Pupils: Pupils are equal, round, and reactive to light.  Cardiovascular:     Rate and Rhythm: Normal rate and regular rhythm.  Pulmonary:     Effort: Pulmonary effort is normal.     Breath sounds: Normal breath sounds.  Abdominal:     General: Bowel sounds are normal.     Palpations: Abdomen is soft.     Tenderness: There is no abdominal tenderness.  Musculoskeletal:        General: Normal range of motion.     Cervical back: Neck supple.  Skin:    General: Skin is warm and dry.  Neurological:     Mental Status: He is alert and oriented to person, place, and time.     Cranial Nerves: No cranial nerve deficit.     Deep Tendon Reflexes:     Reflex Scores:      Patellar reflexes are 2+ on the right side and 2+ on the left side. Psychiatric:        Mood and Affect: Mood normal.            Assessment & Plan:

## 2020-02-27 ENCOUNTER — Other Ambulatory Visit: Payer: Self-pay | Admitting: Primary Care

## 2020-02-27 DIAGNOSIS — F419 Anxiety disorder, unspecified: Secondary | ICD-10-CM

## 2020-02-27 DIAGNOSIS — F32A Depression, unspecified: Secondary | ICD-10-CM

## 2020-08-29 ENCOUNTER — Encounter: Payer: Self-pay | Admitting: Primary Care

## 2020-08-29 ENCOUNTER — Other Ambulatory Visit: Payer: Self-pay

## 2020-08-29 ENCOUNTER — Ambulatory Visit (INDEPENDENT_AMBULATORY_CARE_PROVIDER_SITE_OTHER): Payer: 59 | Admitting: Primary Care

## 2020-08-29 VITALS — BP 104/68 | HR 82 | Temp 98.1°F | Ht 70.0 in | Wt 155.0 lb

## 2020-08-29 DIAGNOSIS — F32A Depression, unspecified: Secondary | ICD-10-CM

## 2020-08-29 DIAGNOSIS — F419 Anxiety disorder, unspecified: Secondary | ICD-10-CM | POA: Diagnosis not present

## 2020-08-29 DIAGNOSIS — Z0000 Encounter for general adult medical examination without abnormal findings: Secondary | ICD-10-CM

## 2020-08-29 MED ORDER — CITALOPRAM HYDROBROMIDE 40 MG PO TABS: ORAL_TABLET | ORAL | 3 refills | Status: DC

## 2020-08-29 NOTE — Assessment & Plan Note (Signed)
Doing well on citalopram 40 mg, continue same. Refills sent to pharmacy.

## 2020-08-29 NOTE — Patient Instructions (Signed)
It was a pleasure to see you today!   Preventive Care 68-27 Years Old, Male Preventive care refers to lifestyle choices and visits with your health care provider that can promote health and wellness. This includes:  A yearly physical exam. This is also called an annual wellness visit.  Regular dental and eye exams.  Immunizations.  Screening for certain conditions.  Healthy lifestyle choices, such as: ? Eating a healthy diet. ? Getting regular exercise. ? Not using drugs or products that contain nicotine and tobacco. ? Limiting alcohol use. What can I expect for my preventive care visit? Physical exam Your health care provider may check your:  Height and weight. These may be used to calculate your BMI (body mass index). BMI is a measurement that tells if you are at a healthy weight.  Heart rate and blood pressure.  Body temperature.  Skin for abnormal spots. Counseling Your health care provider may ask you questions about your:  Past medical problems.  Family's medical history.  Alcohol, tobacco, and drug use.  Emotional well-being.  Home life and relationship well-being.  Sexual activity.  Diet, exercise, and sleep habits.  Work and work Astronomer.  Access to firearms. What immunizations do I need? Vaccines are usually given at various ages, according to a schedule. Your health care provider will recommend vaccines for you based on your age, medical history, and lifestyle or other factors, such as travel or where you work.   What tests do I need? Blood tests  Lipid and cholesterol levels. These may be checked every 5 years starting at age 15.  Hepatitis C test.  Hepatitis B test. Screening  Diabetes screening. This is done by checking your blood sugar (glucose) after you have not eaten for a while (fasting).  Genital exam to check for testicular cancer or hernias.  STD (sexually transmitted disease) testing, if you are at risk. Talk with your  health care provider about your test results, treatment options, and if necessary, the need for more tests.   Follow these instructions at home: Eating and drinking  Eat a healthy diet that includes fresh fruits and vegetables, whole grains, lean protein, and low-fat dairy products.  Drink enough fluid to keep your urine pale yellow.  Take vitamin and mineral supplements as recommended by your health care provider.  Do not drink alcohol if your health care provider tells you not to drink.  If you drink alcohol: ? Limit how much you have to 0-2 drinks a day. ? Be aware of how much alcohol is in your drink. In the [place] one drink equals one 12 oz bottle of beer (355 mL), one 5 oz glass of wine (148 mL), or one 1 oz glass of hard liquor (44 mL).   Lifestyle  Take daily care of your teeth and gums. Brush your teeth every morning and night with fluoride toothpaste. Floss one time each day.  Stay active. Exercise for at least 30 minutes 5 or more days each week.  Do not use any products that contain nicotine or tobacco, such as cigarettes, e-cigarettes, and chewing tobacco. If you need help quitting, ask your health care provider.  Do not use drugs.  If you are sexually active, practice safe sex. Use a condom or other form of protection to prevent STIs (sexually transmitted infections).  Find healthy ways to cope with stress, such as: ? Meditation, yoga, or listening to music. ? Journaling. ? Talking to a trusted person. ? Spending time with friends and  family. Safety  Always wear your seat belt while driving or riding in a vehicle.  Do not drive: ? If you have been drinking alcohol. Do not ride with someone who has been drinking. ? When you are tired or distracted. ? While texting.  Wear a helmet and other protective equipment during sports activities.  If you have firearms in your house, make sure you follow all gun safety procedures.  Seek help if you have been physically  or sexually abused. What's next?  Go to your health care provider once a year for an annual wellness visit.  Ask your health care provider how often you should have your eyes and teeth checked.  Stay up to date on all vaccines. This information is not intended to replace advice given to you by your health care provider. Make sure you discuss any questions you have with your health care provider. Document Revised: 12/16/2018 Document Reviewed: 03/26/2018 Elsevier Patient Education  2021 [employer].

## 2020-08-29 NOTE — Progress Notes (Signed)
Subjective:    Patient ID: Mike Rodriguez, male    DOB: October 22, 1995, 25 y.o.   MRN: [id number]  HPI  Mike Rodriguez is a very pleasant 25 y.o. male who presents today for complete physical.  Immunizations: -Tetanus: 2013 -Influenza: Did not receive -Covid-19: Has not completed  -HPV: Completed series  Diet: He endorses a fair diet.  Exercise: He is walking outdoors some.  Eye exam: No recent exam Dental exam: No recent visit  BP Readings from Last 3 Encounters:  08/29/20 104/68  10/13/19 106/72  10/07/18 116/85      Review of Systems  Constitutional: Negative for unexpected weight change.  HENT: Negative for rhinorrhea.   Eyes: Negative for visual disturbance.  Respiratory: Negative for shortness of breath.   Cardiovascular: Negative for chest pain.  Gastrointestinal: Negative for constipation and diarrhea.  Genitourinary: Negative for difficulty urinating.  Musculoskeletal: Negative for arthralgias.  Skin: Negative for rash.  Allergic/Immunologic: Negative for environmental allergies.  Neurological: Negative for dizziness, numbness and headaches.  Psychiatric/Behavioral: The patient is not nervous/anxious.          Past Medical History:  Diagnosis Date  . Anxiety and depression   . Palpitations 09/17/2017  . Pulmonary embolism [facility])     Social History   Socioeconomic History  . Marital status: Single    Spouse name: Not on file  . Number of children: Not on file  . Years of education: Not on file  . Highest education level: Not on file  Occupational History  . Not on file  Tobacco Use  . Smoking status: Never Smoker  . Smokeless tobacco: Never Used  Vaping Use  . Vaping Use: Former  Substance and Sexual Activity  . Alcohol use: Not on file  . Drug use: Not on file  . Sexual activity: Not on file  Other Topics Concern  . Not on file  Social History Narrative   Single.   Currently not employed.   Enjoys playing computer games.    Social  Determinants of Health   Financial Resource Strain: Not on file  Food Insecurity: Not on file  Transportation Needs: Not on file  Physical Activity: Not on file  Stress: Not on file  Social Connections: Not on file  Intimate Partner Violence: Not on file    Past Surgical History:  Procedure Laterality Date  . APPENDECTOMY  2006    Family History  Problem Relation Age of Onset  . Hypertension Mother   . Kidney disease Mother   . Arthritis Maternal Grandmother   . Hyperlipidemia Maternal Grandmother   . Hypertension Maternal Grandmother     No Known Allergies  Current Outpatient Medications on File Prior to Visit  Medication Sig Dispense Refill  . citalopram (CELEXA) 40 MG tablet TAKE 1 TABLET BY MOUTH DAILY. FOR ANXIETY AND DEPRESSION 90 tablet 1  . diphenhydrAMINE (BENADRYL) 25 MG tablet Take 25 mg by mouth every 6 (six) hours as needed for allergies.    . Multiple Vitamin (MULTIVITAMIN) tablet Take 1 tablet by mouth daily.     No current facility-administered medications on file prior to visit.    BP 104/68   Pulse 82   Temp 98.1 F (36.7 C) (Temporal)   Ht 5\' 10"  (1.778 m)   Wt 155 lb (70.3 kg)   SpO2 98%   BMI 22.24 kg/m  Objective:   Physical Exam HENT:     Right Ear: Tympanic membrane and ear canal normal.     Left  Ear: Tympanic membrane and ear canal normal.     Nose: Nose normal.     Right Sinus: No maxillary sinus tenderness or frontal sinus tenderness.     Left Sinus: No maxillary sinus tenderness or frontal sinus tenderness.  Eyes:     Conjunctiva/sclera: Conjunctivae normal.     Pupils: Pupils are equal, round, and reactive to light.  Neck:     Thyroid: No thyromegaly.     Vascular: No carotid bruit.  Cardiovascular:     Rate and Rhythm: Normal rate and regular rhythm.     Heart sounds: Normal heart sounds.  Pulmonary:     Effort: Pulmonary effort is normal.     Breath sounds: Normal breath sounds. No wheezing or rales.  Abdominal:      General: Bowel sounds are normal.     Palpations: Abdomen is soft.     Tenderness: There is no abdominal tenderness.  Musculoskeletal:        General: Normal range of motion.     Cervical back: Neck supple.  Skin:    General: Skin is warm and dry.  Neurological:     Mental Status: He is alert and oriented to person, place, and time.     Cranial Nerves: No cranial nerve deficit.     Deep Tendon Reflexes: Reflexes are normal and symmetric.  Psychiatric:        Mood and Affect: Mood normal.           Assessment & Plan:      This visit occurred during the SARS-CoV-2 public health emergency.  Safety protocols were in place, including screening questions prior to the visit, additional usage of staff PPE, and extensive cleaning of exam room while observing appropriate contact time as indicated for disinfecting solutions.

## 2020-08-29 NOTE — Assessment & Plan Note (Signed)
Tetanus UTD, due in 2023. He is considering the Covid vaccines.   Encouraged a healthy diet and regular exercise.   Exam today stable. No labs needed.

## 2021-06-28 ENCOUNTER — Ambulatory Visit
Admission: EM | Admit: 2021-06-28 | Discharge: 2021-06-28 | Disposition: A | Discharge status: Home / Self Care | Payer: Self-pay | Attending: Emergency Medicine | Admitting: Emergency Medicine

## 2021-06-28 ENCOUNTER — Encounter: Payer: Self-pay | Admitting: Emergency Medicine

## 2021-06-28 ENCOUNTER — Other Ambulatory Visit: Payer: Self-pay

## 2021-06-28 LAB — POCT RAPID STREP A (OFFICE): Rapid Strep A Screen: NEGATIVE

## 2021-06-28 MED ORDER — POLYMYXIN B-TRIMETHOPRIM 10000-0.1 UNIT/ML-% OP SOLN: 1.0000 [drp] | Freq: Four times a day (QID) | OPHTHALMIC | 0 refills | Status: AC

## 2021-06-28 NOTE — Discharge Instructions (Addendum)
Your strep test is negative.  Your COVID and Flu tests are pending.  You should self quarantine until the test results are back.   ? ?Take Tylenol or ibuprofen as needed for fever or discomfort.  Rest and keep yourself hydrated.   ? ?Use the antibiotic eye drops as directed.   ? ?Follow-up with your primary care provider if your symptoms are not improving.   ?

## 2021-06-28 NOTE — ED Provider Notes (Signed)
?[facility] ? ? ? ?CSN: [id number] ?Arrival date & time: 06/28/21  1430 ? ? ?  ? ?History   ?Chief Complaint ?Chief Complaint  ?Patient presents with  ? Cough  ? Nasal Congestion  ? Eye Problem  ? ? ?HPI ?Mike Rodriguez is a 26 y.o. male.  Accompanied by his mother, patient presents with 1 week history of congestion, sore throat, cough.  He reports 2-day history of eye redness, itching, drainage, crusting in his lashes.  He has a family member with pinkeye.  No eye injury.  No changes in his vision or acute eye pain.  No fever, chills, rash, shortness of breath, vomiting, diarrhea, or other symptoms.  No treatments attempted at home.  His medical history includes pulmonary embolism, anxiety, depression. ? ?The history is provided by the patient, a parent and medical records.  ? ?Past Medical History:  ?Diagnosis Date  ? Anxiety and depression   ? Palpitations 09/17/2017  ? Pulmonary embolism (HCC)   ? ? ?Patient Active Problem List  ? Diagnosis Date Noted  ? Hyperglycemia 10/07/2018  ? Preventative health care 09/17/2017  ? Anxiety and depression 08/21/2016  ? ? ?Past Surgical History:  ?Procedure Laterality Date  ? APPENDECTOMY  2006  ? ? ? ? ? ?Home Medications   ? ?Prior to Admission medications   ?Medication Sig Start Date End Date Taking? Authorizing Provider  ?trimethoprim-polymyxin b (POLYTRIM) ophthalmic solution Place 1 drop into both eyes 4 (four) times daily for 7 days. 06/28/21 07/05/21 Yes Mickie Bail, NP  ?citalopram (CELEXA) 40 MG tablet TAKE 1 TABLET BY MOUTH DAILY. FOR ANXIETY AND DEPRESSION 08/29/20   Doreene Nest, NP  ?diphenhydrAMINE (BENADRYL) 25 MG tablet Take 25 mg by mouth every 6 (six) hours as needed for allergies.    [provider]  ?Multiple Vitamin (MULTIVITAMIN) tablet Take 1 tablet by mouth daily.    [provider]  ? ? ?Family History ?Family History  ?Problem Relation Age of Onset  ? Hypertension Mother   ? Kidney disease Mother   ? Arthritis Maternal  Grandmother   ? Hyperlipidemia Maternal Grandmother   ? Hypertension Maternal Grandmother   ? ? ?Social History ?Social History  ? ?Tobacco Use  ? Smoking status: Never  ? Smokeless tobacco: Never  ?Vaping Use  ? Vaping Use: Former  ? ? ? ?Allergies   ?Patient has no known allergies. ? ? ?Review of Systems ?Review of Systems  ?Constitutional:  Negative for chills and fever.  ?HENT:  Positive for congestion and sore throat. Negative for ear pain.   ?Eyes:  Positive for discharge, redness and itching. Negative for pain and visual disturbance.  ?Respiratory:  Positive for cough. Negative for shortness of breath.   ?Cardiovascular:  Negative for chest pain and palpitations.  ?Gastrointestinal:  Negative for diarrhea and vomiting.  ?Skin:  Negative for color change and rash.  ?All other systems reviewed and are negative. ? ? ?Physical Exam ?Triage Vital Signs ?ED Triage Vitals  ?Enc Vitals Group  ?   BP 06/28/21 1448 118/87  ?   Pulse Rate 06/28/21 1448 (!) 126  ?   Resp 06/28/21 1448 18  ?   Temp 06/28/21 1448 98.8 ?F (37.1 ?C)  ?   Temp src --   ?   SpO2 06/28/21 1448 98 %  ?   Weight --   ?   Height --   ?   Head Circumference --   ?   Peak  Flow --   ?   Pain Score 06/28/21 1436 2  ?   Pain Loc --   ?   Pain Edu? --   ?   Excl. in GC? --   ? ?No data found. ? ?Updated Vital Signs ?BP 118/87   Pulse (!) 126   Temp 98.8 ?F (37.1 ?C)   Resp 18   SpO2 98%  ? ?Visual Acuity ?Right Eye Distance:   ?Left Eye Distance:   ?Bilateral Distance:   ? ?Right Eye Near:   ?Left Eye Near:    ?Bilateral Near:    ? ?Physical Exam ?Vitals and nursing note reviewed.  ?Constitutional:   ?   General: He is not in acute distress. ?   Appearance: Normal appearance. He is well-developed. He is not ill-appearing.  ?HENT:  ?   Right Ear: Tympanic membrane normal.  ?   Left Ear: Tympanic membrane normal.  ?   Nose: Nose normal.  ?   Mouth/Throat:  ?   Mouth: Mucous membranes are moist.  ?   Pharynx: Oropharynx is clear.  ?Eyes:  ?   General:  Lids are normal. Vision grossly intact.  ?   Extraocular Movements: Extraocular movements intact.  ?   Conjunctiva/sclera:  ?   Right eye: Right conjunctiva is injected.  ?   Left eye: Left conjunctiva is injected.  ?   Pupils: Pupils are equal, round, and reactive to light.  ?   Comments: Conjunctiva injected, left > right.   ?Cardiovascular:  ?   Rate and Rhythm: Normal rate and regular rhythm.  ?   Heart sounds: Normal heart sounds.  ?Pulmonary:  ?   Effort: Pulmonary effort is normal. No respiratory distress.  ?   Breath sounds: Normal breath sounds.  ?Musculoskeletal:  ?   Cervical back: Neck supple.  ?Skin: ?   General: Skin is warm and dry.  ?Neurological:  ?   Mental Status: He is alert.  ?Psychiatric:     ?   Mood and Affect: Mood normal.     ?   Behavior: Behavior normal.  ? ? ? ?UC Treatments / Results  ?Labs ?(all labs ordered are listed, but only abnormal results are displayed) ?Labs Reviewed  ?COVID-19, FLU A+B NAA  ?POCT RAPID STREP A (OFFICE)  ? ? ?EKG ? ? ?Radiology ?No results found. ? ?Procedures ?Procedures (including critical care time) ? ?Medications Ordered in UC ?Medications - No data to display ? ?Initial Impression / Assessment and Plan / UC Course  ?I have reviewed the triage vital signs and the nursing notes. ? ?Pertinent labs & imaging results that were available during my care of the patient were reviewed by me and considered in my medical decision making (see chart for details). ? ?Viral illness, conjunctivitis.  Rapid strep negative.  COVID and Flu pending.  Instructed patient to self quarantine per CDC guidelines.  Treating conjunctivitis with Polytrim eyedrops.  Discussed symptomatic treatment for other symptoms including Tylenol or ibuprofen, rest, hydration.  Instructed patient to follow up with PCP if symptoms are not improving.  Patient agrees to plan of care. ? ? ? ?Final Clinical Impressions(s) / UC Diagnoses  ? ?Final diagnoses:  ?Viral illness  ?Acute conjunctivitis of both  eyes, unspecified acute conjunctivitis type  ? ? ? ?Discharge Instructions   ? ?  ?Your strep test is negative.  Your COVID and Flu tests are pending.  You should self quarantine until the test results are back.   ? ?  Take Tylenol or ibuprofen as needed for fever or discomfort.  Rest and keep yourself hydrated.   ? ?Use the antibiotic eye drops as directed.   ? ?Follow-up with your primary care provider if your symptoms are not improving.   ? ? ? ? ?ED Prescriptions   ? ? Medication Sig Dispense Auth. Provider  ? trimethoprim-polymyxin b (POLYTRIM) ophthalmic solution Place 1 drop into both eyes 4 (four) times daily for 7 days. 10 mL Mickie Bail, NP  ? ?  ? ?PDMP not reviewed this encounter. ?  ?Mickie Bail, NP ?06/28/21 1533 ? ?

## 2021-06-28 NOTE — ED Triage Notes (Signed)
Pt here with cough, congestion and bilateral eye redness and irritation x 2 days.  ?

## 2021-06-29 ENCOUNTER — Ambulatory Visit: Payer: Self-pay

## 2021-06-30 LAB — COVID-19, FLU A+B NAA
Influenza A, NAA: NOT DETECTED
Influenza B, NAA: NOT DETECTED
SARS-CoV-2, NAA: NOT DETECTED

## 2021-09-12 ENCOUNTER — Other Ambulatory Visit: Payer: Self-pay | Admitting: Primary Care

## 2021-09-12 DIAGNOSIS — F32A Anxiety disorder, unspecified: Secondary | ICD-10-CM

## 2021-10-19 ENCOUNTER — Other Ambulatory Visit: Payer: Self-pay | Admitting: Primary Care

## 2021-10-19 DIAGNOSIS — F32A Depression, unspecified: Secondary | ICD-10-CM

## 2022-02-10 ENCOUNTER — Other Ambulatory Visit: Payer: Self-pay | Admitting: Primary Care

## 2022-02-10 DIAGNOSIS — F32A Depression, unspecified: Secondary | ICD-10-CM

## 2022-02-10 NOTE — Telephone Encounter (Signed)
Please call patient's mom:  Patient overdue for office visit and will need to be seen ASAP for further refills. Please schedule. Okay to add on anywhere.

## 2022-02-15 ENCOUNTER — Encounter: Payer: Self-pay | Admitting: Primary Care

## 2022-02-15 ENCOUNTER — Ambulatory Visit (INDEPENDENT_AMBULATORY_CARE_PROVIDER_SITE_OTHER): Payer: Self-pay | Admitting: Primary Care

## 2022-02-15 VITALS — BP 130/78 | HR 88 | Temp 98.1°F | Ht 70.0 in | Wt 159.0 lb

## 2022-02-15 DIAGNOSIS — F32A Depression, unspecified: Secondary | ICD-10-CM

## 2022-02-15 DIAGNOSIS — F419 Anxiety disorder, unspecified: Secondary | ICD-10-CM

## 2022-02-15 MED ORDER — CITALOPRAM HYDROBROMIDE 40 MG PO TABS: ORAL_TABLET | ORAL | 3 refills | Status: DC

## 2022-02-15 NOTE — Assessment & Plan Note (Signed)
Controlled.  Continue citalopram 40 mg daily. Refills sent to pharmacy.

## 2022-02-15 NOTE — Patient Instructions (Signed)
Continue citalopram 40 mg daily for anxiety.  It was a pleasure to see you today!

## 2022-02-15 NOTE — Progress Notes (Signed)
Subjective:    Patient ID: Mike Rodriguez, male    DOB: 02/10/1996, 26 y.o.   MRN: [id number]  HPI  Mike Rodriguez is a very pleasant 26 y.o. male with a history of anxiety and depression who presents today for follow-up of anxiety depression. His mother joins [place] today.  Currently managed on citalopram 40 mg daily for which he has been taking over the last year. Overall he feels well managed on citalopram. He living at home with his parents. Spends time gaming and flying drones. He denies SI/HI, chest pain, sleep disturbance.    Review of Systems  Respiratory:  Negative for shortness of breath.   Cardiovascular:  Negative for chest pain.  Gastrointestinal:  Negative for constipation.  Psychiatric/Behavioral:  Negative for suicidal ideas. The patient is not nervous/anxious.          Past Medical History:  Diagnosis Date   Anxiety and depression    Palpitations 09/17/2017   Pulmonary embolism (HCC)     Social History   Socioeconomic History   Marital status: Single    Spouse name: Not on file   Number of children: Not on file   Years of education: Not on file   Highest education level: Not on file  Occupational History   Not on file  Tobacco Use   Smoking status: Never   Smokeless tobacco: Never  Vaping Use   Vaping Use: Former  Substance and Sexual Activity   Alcohol use: Not on file   Drug use: Not on file   Sexual activity: Not on file  Other Topics Concern   Not on file  Social History Narrative   Single.   Currently not employed.   Enjoys playing computer games.    Social Determinants of Health   Financial Resource Strain: Not on file  Food Insecurity: Not on file  Transportation Needs: Not on file  Physical Activity: Not on file  Stress: Not on file  Social Connections: Not on file  Intimate Partner Violence: Not on file    Past Surgical History:  Procedure Laterality Date   APPENDECTOMY  2006    Family History  Problem Relation Age of Onset    Hypertension Mother    Kidney disease Mother    Arthritis Maternal Grandmother    Hyperlipidemia Maternal Grandmother    Hypertension Maternal Grandmother     No Known Allergies  Current Outpatient Medications on File Prior to Visit  Medication Sig Dispense Refill   diphenhydrAMINE (BENADRYL) 25 MG tablet Take 25 mg by mouth every 6 (six) hours as needed for allergies.     Multiple Vitamin (MULTIVITAMIN) tablet Take 1 tablet by mouth daily.     No current facility-administered medications on file prior to visit.    BP 130/78   Pulse 88   Temp 98.1 F (36.7 C) (Temporal)   Ht 5\' 10"  (1.778 m)   Wt 159 lb (72.1 kg)   SpO2 99%   BMI 22.81 kg/m  Objective:   Physical Exam Cardiovascular:     Rate and Rhythm: Normal rate and regular rhythm.  Pulmonary:     Effort: Pulmonary effort is normal.     Breath sounds: Normal breath sounds. No wheezing or rales.  Musculoskeletal:     Cervical back: Neck supple.  Skin:    General: Skin is warm and dry.  Neurological:     Mental Status: He is alert and oriented to person, place, and time.  Assessment & Plan:   Problem List Items Addressed This Visit       Other   Anxiety and depression - Primary    Controlled.  Continue citalopram 40 mg daily. Refills sent to pharmacy.      Relevant Medications   citalopram (CELEXA) 40 MG tablet       Pleas Koch, NP

## 2022-07-25 ENCOUNTER — Encounter: Payer: Self-pay | Admitting: Internal Medicine

## 2022-07-25 ENCOUNTER — Ambulatory Visit (INDEPENDENT_AMBULATORY_CARE_PROVIDER_SITE_OTHER): Payer: Self-pay | Admitting: Internal Medicine

## 2022-07-25 VITALS — BP 116/80 | HR 122 | Temp 97.5°F | Ht 70.0 in | Wt 157.0 lb

## 2022-07-25 DIAGNOSIS — R350 Frequency of micturition: Secondary | ICD-10-CM | POA: Insufficient documentation

## 2022-07-25 LAB — POC URINALSYSI DIPSTICK (AUTOMATED)
Blood, UA: NEGATIVE
Glucose, UA: NEGATIVE
Leukocytes, UA: NEGATIVE
Nitrite, UA: NEGATIVE
Protein, UA: POSITIVE — AB
Spec Grav, UA: >=1.03 — AB (ref 1.010–1.025)
Urobilinogen, UA: 0.2 E.U./dL
pH, UA: 5.5 (ref 5.0–8.0)

## 2022-07-25 NOTE — Assessment & Plan Note (Signed)
Has had some chronic urinary symptoms---dribbling, etc Prostate negative and never had sex Now more prominent frequency--but no dysuria Urinalysis negative for nitrite and leukocytes but SG is high  Discussed irritable bladder Asked him to increase water intake  If ongoing symptoms, would set up with urologist

## 2022-07-25 NOTE — Progress Notes (Signed)
   Subjective:    Patient ID: Mike Rodriguez, male    DOB: 03-31-1996, 27 y.o.   MRN: [id number]  HPI Here with mom due to urinary symptoms  He feels like he constantly has to pee No burning--just a pressure sensation Goes back for a while--but more prominent in the past week or 2 Now feels he has to get up at night--which is new  Fairly chronic dribbling or incomplete emptying  No blood  No fevers  Some caffeine sodas when eating -- three daily No alcohol Mouth dry---doesn't drink much inbetween meals  No sexual concerns Relationship--but she is in [place]. Symptoms may have started when this got more intense (like talking about how to meet)  Current Outpatient Medications on File Prior to Visit  Medication Sig Dispense Refill   citalopram (CELEXA) 40 MG tablet TAKE 1 TABLET BY MOUTH DAILY. FOR ANXIETY AND DEPRESSION. 90 tablet 3   diphenhydrAMINE (BENADRYL) 25 MG tablet Take 25 mg by mouth every 6 (six) hours as needed for allergies.     Multiple Vitamin (MULTIVITAMIN) tablet Take 1 tablet by mouth daily.     No current facility-administered medications on file prior to visit.    No Known Allergies  Past Medical History:  Diagnosis Date   Anxiety and depression    Palpitations 09/17/2017   Pulmonary embolism     Past Surgical History:  Procedure Laterality Date   APPENDECTOMY  2006    Family History  Problem Relation Age of Onset   Hypertension Mother    Kidney disease Mother    Arthritis Maternal Grandmother    Hyperlipidemia Maternal Grandmother    Hypertension Maternal Grandmother     Social History   Socioeconomic History   Marital status: Single    Spouse name: Not on file   Number of children: Not on file   Years of education: Not on file   Highest education level: Not on file  Occupational History   Not on file  Tobacco Use   Smoking status: Never    Passive exposure: Never   Smokeless tobacco: Never  Vaping Use   Vaping Use: Former   Substance and Sexual Activity   Alcohol use: Not on file   Drug use: Not on file   Sexual activity: Not on file  Other Topics Concern   Not on file  Social History Narrative   Single.   Currently not employed.   Enjoys playing computer games.    Social Determinants of Health   Financial Resource Strain: Not on file  Food Insecurity: Not on file  Transportation Needs: Not on file  Physical Activity: Not on file  Stress: Not on file  Social Connections: Not on file  Intimate Partner Violence: Not on file   Review of Systems Bowels move fine---goes before and after every meal--or with anxiety No abdominal pain     Objective:   Physical Exam Constitutional:      Appearance: Normal appearance.  Abdominal:     Palpations: Abdomen is soft.     Tenderness: There is no abdominal tenderness.  Genitourinary:    Prostate: Normal.     Rectum: Normal.     Comments: No prostate bogginess or tenderness Neurological:     Mental Status: He is alert.            Assessment & Plan:

## 2022-07-25 NOTE — Patient Instructions (Signed)
Please increase your water intake ---even in between meals.  I would recommend cutting back on soda completely to see if that makes things better. Soda/caffeine are bladder irritants and can bring on symptoms like you have. If you have ongoing symptoms, we can set you up with a urologist.

## 2022-09-16 ENCOUNTER — Emergency Department
Admission: EM | Admit: 2022-09-16 | Discharge: 2022-09-16 | Disposition: A | Discharge status: Home / Self Care | Payer: Self-pay | Attending: Emergency Medicine | Admitting: Emergency Medicine

## 2022-09-16 ENCOUNTER — Other Ambulatory Visit: Payer: Self-pay

## 2022-09-16 DIAGNOSIS — R3911 Hesitancy of micturition: Secondary | ICD-10-CM | POA: Insufficient documentation

## 2022-09-16 LAB — CHLAMYDIA/NGC RT PCR (ARMC ONLY)
Chlamydia Tr: NOT DETECTED
N gonorrhoeae: NOT DETECTED

## 2022-09-16 LAB — URINALYSIS, ROUTINE W REFLEX MICROSCOPIC
Bilirubin Urine: NEGATIVE
Glucose, UA: NEGATIVE mg/dL
Hgb urine dipstick: NEGATIVE
Ketones, ur: NEGATIVE mg/dL
Leukocytes,Ua: NEGATIVE
Nitrite: NEGATIVE
Protein, ur: NEGATIVE mg/dL
Specific Gravity, Urine: 1.032 — ABNORMAL HIGH (ref 1.005–1.030)
pH: 6 (ref 5.0–8.0)

## 2022-09-16 MED ORDER — PHENAZOPYRIDINE HCL 100 MG PO TABS: 100.0000 mg | ORAL_TABLET | Freq: Three times a day (TID) | ORAL | 0 refills | Status: DC | PRN

## 2022-09-16 NOTE — Discharge Instructions (Signed)
Try taking the Pyridium over the next few days and see if it helps your symptoms.  If it does not help, you do not need to continue taking it.  Follow-up with the urologist.  Return to the ER for new, worsening, or persistent severe urinary symptoms, abdominal or flank pain, fever, blood in the urine, or any other new or worsening symptoms that concern you.

## 2022-09-16 NOTE — ED Provider Notes (Signed)
[facility] Provider Note    Event Date/Time   First MD Initiated Contact with Patient 09/16/22 0230     (approximate)   History   Urinary Frequency (X4 weeks)   HPI  Mike Rodriguez is a 27 y.o. male with a history of anxiety who presents with urinary symptoms for the last 2 weeks.  Specifically the patient reports frequency, hesitancy, and occasional dysuria.  He denies any penile discharge or any testicular or abdominal pain.  He states sometimes the frequency will happen when he is particularly anxious.  He states he is not sexually active.  I reviewed the past medical records.  His most recent outpatient encounter was with primary care on 4/11 for the same urinary symptoms and he had a urine dipstick at that time which was positive for protein.  Physical Exam   Triage Vital Signs: ED Triage Vitals [09/16/22 0045]  Enc Vitals Group     BP (!) 128/99     Pulse Rate 100     Resp 16     Temp 98.1 F (36.7 C)     Temp Source Oral     SpO2 99 %     Weight 158 lb 11.7 oz (72 kg)     Height 5\' 10"  (1.778 m)     Head Circumference      Peak Flow      Pain Score 0     Pain Loc      Pain Edu?      Excl. in GC?     Most recent vital signs: Vitals:   09/16/22 0045 09/16/22 0304  BP: (!) 128/99 126/71  Pulse: 100 78  Resp: 16 16  Temp: 98.1 F (36.7 C) 98 F (36.7 C)  SpO2: 99% 100%     General: Awake, no distress.  CV:  Good peripheral perfusion.  Resp:  Normal effort.  Abd:  Soft and nontender.  No distention.  Other:  Normal external genitalia.  No penile discharge.  No testicular swelling or tenderness.  No palpable lymphadenopathy.  No rashes or lesions.   ED Results / Procedures / Treatments   Labs (all labs ordered are listed, but only abnormal results are displayed) Labs Reviewed  URINALYSIS, ROUTINE W REFLEX MICROSCOPIC - Abnormal; Notable for the following components:      Result Value   Color, Urine YELLOW (*)     APPearance CLEAR (*)    Specific Gravity, Urine 1.032 (*)    All other components within normal limits  CHLAMYDIA/NGC RT PCR ([facility] ONLY)               EKG    RADIOLOGY    PROCEDURES:  Critical Care performed: No  Procedures   MEDICATIONS ORDERED IN ED: Medications - No data to display   IMPRESSION / MDM / ASSESSMENT AND PLAN / ED COURSE  I reviewed the triage vital signs and the nursing notes.  27 year old male with a history of anxiety presents with urinary frequency, hesitancy, dysuria over the last several weeks.  Physical exam is unremarkable.  Differential diagnosis includes, but is not limited to, UTI, cystitis, STI, structural urologic etiology, anxiety.  Patient's presentation is most consistent with acute complicated illness / injury requiring diagnostic workup.  Urinalysis is negative for acute findings.  It shows no protein.  I have added on a GC/CT although my clinical suspicion is low given the patient's sexual history.  There is no indication for imaging at this time.  I have prescribed Pyridium for symptomatic treatment and recommend follow-up with urology.  The patient is stable for discharge home at this time.  Return precautions given, and he expresses understanding.   FINAL CLINICAL IMPRESSION(S) / ED DIAGNOSES   Final diagnoses:  Urinary hesitancy     Rx / DC Orders   ED Discharge Orders          Ordered    phenazopyridine (PYRIDIUM) 100 MG tablet  3 times daily PRN        09/16/22 0253             Note:  This document was prepared using Dragon voice recognition software and may include unintentional dictation errors.    Dionne Bucy, MD 09/16/22 [phone]

## 2022-09-16 NOTE — ED Triage Notes (Signed)
Pt to ed from home for urinary frequency x 4 weeks. Pt is caox4, in no acute distress and ambulatory in triage. Pt denies burning when urination but has discharge coming from penis. Pt states "I am a virgin" so no sex partners. Pt went to doc recently and said was normal..

## 2022-09-17 ENCOUNTER — Telehealth: Payer: Self-pay

## 2022-09-17 NOTE — Transitions of Care (Post Inpatient/ED Visit) (Unsigned)
   09/17/2022  Name: Kainan Langfitt MRN: [id number] DOB: [birth year]  Today's TOC FU Call Status: Today's TOC FU Call Status:: Unsuccessul Call (1st Attempt) Unsuccessful Call (1st Attempt) Date: 09/17/22  Attempted to reach the patient regarding the most recent Inpatient/ED visit.  Follow Up Plan: Additional outreach attempts will be made to reach the patient to complete the Transitions of Care (Post Inpatient/ED visit) call.   Signature   Woodfin Ganja LPN [facility] Nurse Health Advisor Direct Dial [phone]

## 2022-09-18 NOTE — Transitions of Care (Post Inpatient/ED Visit) (Signed)
   09/18/2022  Name: Mike Rodriguez MRN: [id number] DOB: 06/01/1995  Today's TOC FU Call Status: Today's TOC FU Call Status:: Successful TOC FU Call Competed Unsuccessful Call (1st Attempt) Date: 09/17/22 [facility] FU Call Complete Date: 09/18/22  Transition Care Management Follow-up Telephone Call Date of Discharge: 09/16/22 Discharge Facility: [facility]) Type of Discharge: Emergency Department Reason for ED Visit: Other: (Urinary hesitancy) How have you been since you were released from the hospital?: Better Any questions or concerns?: No  Items Reviewed: Did you receive and understand the discharge instructions provided?: Yes Medications obtained,verified, and reconciled?: Yes (Medications Reviewed) Any new allergies since your discharge?: No Dietary orders reviewed?: NA Do you have support at home?: Yes  Medications Reviewed Today: Medications Reviewed Today     Reviewed by Merleen Nicely, LPN (Licensed Practical Nurse) on 09/18/22 at 1422  Med List Status: <None>   Medication Order Taking? Sig Documenting Provider Last Dose Status Informant  citalopram (CELEXA) 40 MG tablet [id number] Yes TAKE 1 TABLET BY MOUTH DAILY. FOR ANXIETY AND DEPRESSION. Doreene Nest, NP Taking Active   diphenhydrAMINE (BENADRYL) 25 MG tablet [id number] Yes Take 25 mg by mouth every 6 (six) hours as needed for allergies. [provider] Taking Active   Multiple Vitamin (MULTIVITAMIN) tablet [id number] Yes Take 1 tablet by mouth daily. [provider] Taking Active   phenazopyridine (PYRIDIUM) 100 MG tablet [id number] Yes Take 1 tablet (100 mg total) by mouth 3 (three) times daily as needed for pain. Dionne Bucy, MD Taking Active             Home Care and Equipment/Supplies: Were Home Health Services Ordered?: No Any new equipment or medical supplies ordered?: No  Functional Questionnaire: Do you need assistance with bathing/showering or  dressing?: No Do you need assistance with meal preparation?: No Do you need assistance with eating?: No Do you have difficulty maintaining continence: No Do you need assistance with getting out of bed/getting out of a chair/moving?: No Do you have difficulty managing or taking your medications?: No  Follow up appointments reviewed: PCP Follow-up appointment confirmed?: No MD Provider Line Number:[phone] Given: Yes Specialist Hospital Follow-up appointment confirmed?: Yes Date of Specialist follow-up appointment?: 10/22/22 Follow-Up Specialty Provider:: urologist Do you need transportation to your follow-up appointment?: No Do you understand care options if your condition(s) worsen?: Yes-patient verbalized understanding    SIGNATURE  Woodfin Ganja LPN [facility] Nurse Health Advisor Direct Dial [phone]

## 2022-10-21 ENCOUNTER — Ambulatory Visit
Admission: RE | Admit: 2022-10-21 | Discharge: 2022-10-21 | Disposition: A | Discharge status: Home / Self Care | Payer: Self-pay | Source: Ambulatory Visit | Attending: Urology | Admitting: Urology

## 2022-10-21 ENCOUNTER — Ambulatory Visit (INDEPENDENT_AMBULATORY_CARE_PROVIDER_SITE_OTHER): Payer: Self-pay | Admitting: Urology

## 2022-10-21 ENCOUNTER — Encounter: Payer: Self-pay | Admitting: Urology

## 2022-10-21 VITALS — BP 120/84 | HR 102 | Ht 70.0 in | Wt 151.0 lb

## 2022-10-21 DIAGNOSIS — Z87442 Personal history of urinary calculi: Secondary | ICD-10-CM

## 2022-10-21 DIAGNOSIS — N390 Urinary tract infection, site not specified: Secondary | ICD-10-CM

## 2022-10-21 DIAGNOSIS — R399 Unspecified symptoms and signs involving the genitourinary system: Secondary | ICD-10-CM

## 2022-10-21 LAB — URINALYSIS, COMPLETE
Bilirubin, UA: NEGATIVE
Glucose, UA: NEGATIVE
Ketones, UA: NEGATIVE
Leukocytes,UA: NEGATIVE
Nitrite, UA: NEGATIVE
Protein,UA: NEGATIVE
RBC, UA: NEGATIVE
Specific Gravity, UA: 1.02 (ref 1.005–1.030)
Urobilinogen, Ur: 0.2 mg/dL (ref 0.2–1.0)
pH, UA: 6 (ref 5.0–7.5)

## 2022-10-21 LAB — MICROSCOPIC EXAMINATION
Epithelial Cells (non renal): NONE SEEN /hpf (ref 0–10)
RBC, Urine: NONE SEEN /hpf (ref 0–2)

## 2022-10-21 LAB — BLADDER SCAN AMB NON-IMAGING: SCA Result: 0

## 2022-10-21 MED ORDER — TAMSULOSIN HCL 0.4 MG PO CAPS: 0.4000 mg | ORAL_CAPSULE | Freq: Every day | ORAL | 1 refills | Status: DC

## 2022-10-21 NOTE — Progress Notes (Signed)
I, Maysun L Gibbs,acting as a scribe for Riki Altes, MD.,have documented all relevant documentation on the behalf of Riki Altes, MD,as directed by  Riki Altes, MD while in the presence of Riki Altes, MD.  10/21/2022 10:46 AM   Army Chaco 06/17/1995 [id number]  Referring provider: Dionne Bucy, MD [address] [place],  [place] [address]  Chief Complaint  Patient presents with   Recurrent UTI    HPI: Mike Rodriguez is a 27 y.o. male referred from [facility] ED for UTI.   Presented to ED 09/16/2022 with a 2 week history of lower urinary tract symptoms including urinary hesitancy, frequency, and occasional dysuria.  He also underwent STI testing, which was negative, though he denied sexual activity. He did have a 4mm left distal ureteral calculus on a CT in 2018 which he subsequently passed. Since late May 2024 with urinary frequency, urgency, and occasional mild dysuria.  IPSS today 25/35 His symptoms are worse at times of increased stress and anxiety.  No gross hematuria or flank, abdominal/pelvic pain.   PMH: Past Medical History:  Diagnosis Date   Anxiety and depression    Palpitations 09/17/2017   Pulmonary embolism [facility])     Surgical History: Past Surgical History:  Procedure Laterality Date   APPENDECTOMY  2006    Home Medications:  Allergies as of 10/21/2022   No Known Allergies      Medication List        Accurate as of October 21, 2022 10:46 AM. If you have any questions, ask your nurse or doctor.          citalopram 40 MG tablet Commonly known as: CELEXA TAKE 1 TABLET BY MOUTH DAILY. FOR ANXIETY AND DEPRESSION.   diphenhydrAMINE 25 MG tablet Commonly known as: BENADRYL Take 25 mg by mouth every 6 (six) hours as needed for allergies.   multivitamin tablet Take 1 tablet by mouth daily.   phenazopyridine 100 MG tablet Commonly known as: PYRIDIUM Take 1 tablet (100 mg total) by mouth 3 (three) times daily as needed for  pain.   tamsulosin 0.4 MG Caps capsule Commonly known as: FLOMAX Take 1 capsule (0.4 mg total) by mouth daily. Started by: Riki Altes, MD        Allergies: No Known Allergies  Family History: Family History  Problem Relation Age of Onset   Hypertension Mother    Kidney disease Mother    Arthritis Maternal Grandmother    Hyperlipidemia Maternal Grandmother    Hypertension Maternal Grandmother     Social History:  reports that he has never smoked. He has never been exposed to tobacco smoke. He has never used smokeless tobacco. No history on file for alcohol use and drug use.   Physical Exam: BP 120/84   Pulse (!) 102   Ht 5\' 10"  (1.778 m)   Wt 151 lb (68.5 kg)   BMI 21.67 kg/m   Constitutional:  Alert and oriented, No acute distress. HEENT: [place] AT Respiratory: Normal respiratory effort, no increased work of breathing. GU: Phallus circumcised without lesions. Meatus normal appearance. Testes descended bilaterally without masses or tenderness. Psychiatric: Normal mood and affect.   Urinalysis Dipstick/microscopy negative.    Pertinent Imaging: CT was personally reviewed and interpreted.   CT RENAL STONE STUDY  Narrative CLINICAL DATA:  Bladder pain.  Left flank pain.  EXAM: CT ABDOMEN AND PELVIS WITHOUT CONTRAST  TECHNIQUE: Multidetector CT imaging of the abdomen and pelvis was performed following the  standard protocol without IV contrast.  COMPARISON:  None.  FINDINGS: Lower chest: No acute abnormality.  Hepatobiliary: No focal liver abnormality is seen. No gallstones, gallbladder wall thickening, or biliary dilatation.  Pancreas: Unremarkable. No pancreatic ductal dilatation or surrounding inflammatory changes.  Spleen: Normal in size without focal abnormality.  Adrenals/Urinary Tract: Adrenal glands are unremarkable. Kidneys are without renal calculi. 1 cm hyper attenuated lesion within the lower pole of the left kidney likely represents a  hemorrhagic cyst. There is a 4 mm calculus within the distal most left ureter, slightly upstream to the vesicoureteral junction. There is an associated moderate left hydroureter and mild left hydronephrosis. Bladder is unremarkable.  Stomach/Bowel: Stomach is within normal limits. Appendix appears normal. No evidence of bowel wall thickening, distention, or inflammatory changes.  Vascular/Lymphatic: No significant vascular findings are present. No enlarged abdominal or pelvic lymph nodes.  Reproductive: Prostate is unremarkable.  Other: No abdominal wall hernia or abnormality. No abdominopelvic ascites.  Musculoskeletal: No acute or significant osseous findings.  IMPRESSION: 4 mm distal left ureteral calculus, causing moderate left hydroureter and mild left hydronephrosis.  1 cm hyper attenuated lesion within the lower pole of the left kidney, may represent a hemorrhagic cyst.  Normal appearance of the right kidney and urinary bladder.   Electronically Signed By: Ted Mcalpine M.D. On: 01/20/2017 17:53   Assessment & Plan:    1. Lower urinary tract symptoms. Symptoms exacerbated at times of stress, anxiety.  Potential etiologies that were discussed, including increased alpha adrenergic bladder neck tone, pelvic floor dysfunction. Trial tamsulosin 0.4 mg daily.  PVR today 0 mL PA follow-up 1 month for symptom recheck. For persistent symptoms consider pelvic floor physical therapy/cystoscopy.   2. Personal history urinary calculi KUB ordered today  I have reviewed the above documentation for accuracy and completeness, and I agree with the above.   Riki Altes, MD  [facility] [address] [place], [place] [address] [phone]

## 2022-11-21 ENCOUNTER — Ambulatory Visit (INDEPENDENT_AMBULATORY_CARE_PROVIDER_SITE_OTHER): Payer: Self-pay | Admitting: Physician Assistant

## 2022-11-21 VITALS — BP 118/74 | HR 88 | Ht 70.0 in | Wt 151.0 lb

## 2022-11-21 DIAGNOSIS — R399 Unspecified symptoms and signs involving the genitourinary system: Secondary | ICD-10-CM

## 2022-11-21 NOTE — Progress Notes (Signed)
11/21/2022 9:23 AM   Army Chaco Dec 28, 1995 [id number]  CC: Chief Complaint  Patient presents with   Follow-up   HPI: Mike Rodriguez is a 27 y.o. male with PMH nephrolithiasis and LUTS including frequency, hesitancy, and occasional dysuria exacerbated by stress and anxiety who presents today for symptom recheck on Flomax.  He is accompanied today by his mother, who contributes to HPI.   Today he reports no change in his urinary symptoms on Flomax.  He continues to describe urinary frequency, hesitancy, straining, and slight dysuria localized to the penis and worse with initiation.  He denies urinary incontinence, weak stream, or split/spraying stream.  PMH: Past Medical History:  Diagnosis Date   Anxiety and depression    Palpitations 09/17/2017   Pulmonary embolism [facility])     Surgical History: Past Surgical History:  Procedure Laterality Date   APPENDECTOMY  2006    Home Medications:  Allergies as of 11/21/2022   No Known Allergies      Medication List        Accurate as of November 21, 2022  9:23 AM. If you have any questions, ask your nurse or doctor.          STOP taking these medications    tamsulosin 0.4 MG Caps capsule Commonly known as: FLOMAX       TAKE these medications    citalopram 40 MG tablet Commonly known as: CELEXA TAKE 1 TABLET BY MOUTH DAILY. FOR ANXIETY AND DEPRESSION.   diphenhydrAMINE 25 MG tablet Commonly known as: BENADRYL Take 25 mg by mouth every 6 (six) hours as needed for allergies.   multivitamin tablet Take 1 tablet by mouth daily.   phenazopyridine 100 MG tablet Commonly known as: PYRIDIUM Take 1 tablet (100 mg total) by mouth 3 (three) times daily as needed for pain.        Allergies:  No Known Allergies  Family History: Family History  Problem Relation Age of Onset   Hypertension Mother    Kidney disease Mother    Arthritis Maternal Grandmother    Hyperlipidemia Maternal Grandmother    Hypertension  Maternal Grandmother     Social History:   reports that he has never smoked. He has never been exposed to tobacco smoke. He has never used smokeless tobacco. No history on file for alcohol use and drug use.  Physical Exam: BP 118/74   Pulse 88   Ht 5\' 10"  (1.778 m)   Wt 151 lb (68.5 kg)   BMI 21.67 kg/m   Constitutional:  Alert and oriented, no acute distress, nontoxic appearing HEENT: [place], AT Cardiovascular: No clubbing, cyanosis, or edema Respiratory: Normal respiratory effort, no increased work of breathing Skin: No rashes, bruises or suspicious lesions Neurologic: Grossly intact, no focal deficits, moving all 4 extremities Psychiatric: Normal mood and affect  Assessment & Plan:   1. Lower urinary tract symptoms (LUTS) No change on Flomax.  Bothersome symptoms including frequency, hesitancy, straining, and dysuria with initiation.  We discussed that the differential includes pelvic floor dysfunction, IC, or bladder outlet obstruction including urethral stricture, though of these I think pelvic floor dysfunction is most likely given mixed symptom picture of storage and obstructive symptoms.  I offered him pelvic floor PT, but he is unsure if he wishes to pursue this.  We discussed pursuing cystoscopy as an alternative, though I explained that a normal cystoscopy would not rule out a diagnosis for IC.  They are more interested in pursuing this.  Will schedule him  for cystoscopy with Dr. Lonna Cobb.  If no significant findings, strongly recommend pelvic floor PT, though may consider amitriptyline or OAB meds as an alternative.  Return in about 4 weeks (around 12/19/2022) for Cysto with Dr. Lonna Cobb.  Carman Ching, PA-C  [facility] [place] [address] [place], [place] [address] [phone]

## 2022-12-30 ENCOUNTER — Ambulatory Visit (INDEPENDENT_AMBULATORY_CARE_PROVIDER_SITE_OTHER): Payer: Self-pay | Admitting: Urology

## 2022-12-30 ENCOUNTER — Encounter: Payer: Self-pay | Admitting: Urology

## 2022-12-30 VITALS — BP 108/73 | HR 94 | Ht 71.0 in | Wt 150.0 lb

## 2022-12-30 DIAGNOSIS — R399 Unspecified symptoms and signs involving the genitourinary system: Secondary | ICD-10-CM

## 2022-12-30 LAB — URINALYSIS, COMPLETE
Bilirubin, UA: NEGATIVE
Glucose, UA: NEGATIVE
Ketones, UA: NEGATIVE
Leukocytes,UA: NEGATIVE
Nitrite, UA: NEGATIVE
Protein,UA: NEGATIVE
Specific Gravity, UA: >1.03 — ABNORMAL HIGH (ref 1.005–1.030)
Urobilinogen, Ur: 0.2 mg/dL (ref 0.2–1.0)
pH, UA: 6 (ref 5.0–7.5)

## 2022-12-30 LAB — MICROSCOPIC EXAMINATION

## 2022-12-30 MED ORDER — TADALAFIL 5 MG PO TABS: 5.0000 mg | ORAL_TABLET | Freq: Every day | ORAL | 0 refills | Status: DC

## 2023-01-01 NOTE — Progress Notes (Signed)
   01/01/23  CC:  Chief Complaint  Patient presents with   Cysto    HPI: Refer to [facility]'s note 11/21/2022.  No change in his voiding pattern  Blood pressure 108/73, pulse 94, height 5\' 11"  (1.803 m), weight 150 lb (68 kg). NED. A&Ox3.   No respiratory distress   Abd soft, NT, ND Normal phallus with bilateral descended testicles  Cystoscopy Procedure Note  Patient identification was confirmed, informed consent was obtained, and patient was prepped using Betadine solution.  Lidocaine jelly was administered per urethral meatus.     Pre-Procedure: - Inspection reveals a normal caliber urethral meatus.  Procedure: The flexible cystoscope was introduced without difficulty - No urethral strictures/lesions are present. - Normal prostate, nonocclusive - Normal bladder neck - Bilateral ureteral orifices identified - Bladder mucosa  reveals no ulcers, tumors, or lesions - No bladder stones - No trabeculation  Retroflexion shows no lesions   Post-Procedure: - Patient tolerated the procedure well  Assessment/ Plan: Lower urinary tract symptoms most likely pelvic floor I discussed with patient I have several patients who have benefited from low-dose tadalafil and he was interested in a 30-day trial.  Rx tadalafil 5 mg sent to pharmacy If no improvement recommend pelvic floor PT   Riki Altes, MD

## 2023-01-30 ENCOUNTER — Ambulatory Visit: Payer: Self-pay | Admitting: Physician Assistant

## 2023-02-09 ENCOUNTER — Other Ambulatory Visit: Payer: Self-pay | Admitting: Primary Care

## 2023-02-09 DIAGNOSIS — F419 Anxiety disorder, unspecified: Secondary | ICD-10-CM

## 2023-02-09 NOTE — Telephone Encounter (Signed)
Patient is due for follow up, this will be required prior to any further refills.  Please schedule, thank you!    

## 2023-03-12 ENCOUNTER — Other Ambulatory Visit: Payer: Self-pay | Admitting: Primary Care

## 2023-03-12 DIAGNOSIS — F419 Anxiety disorder, unspecified: Secondary | ICD-10-CM

## 2023-05-02 ENCOUNTER — Other Ambulatory Visit: Payer: Self-pay | Admitting: Primary Care

## 2023-05-02 DIAGNOSIS — F32A Depression, unspecified: Secondary | ICD-10-CM

## 2023-05-15 ENCOUNTER — Ambulatory Visit (INDEPENDENT_AMBULATORY_CARE_PROVIDER_SITE_OTHER): Payer: Self-pay | Admitting: Primary Care

## 2023-05-15 ENCOUNTER — Encounter: Payer: Self-pay | Admitting: Primary Care

## 2023-05-15 VITALS — BP 108/62 | HR 93 | Temp 98.2°F | Ht 71.0 in | Wt 154.0 lb

## 2023-05-15 DIAGNOSIS — F32A Depression, unspecified: Secondary | ICD-10-CM

## 2023-05-15 DIAGNOSIS — L219 Seborrheic dermatitis, unspecified: Secondary | ICD-10-CM | POA: Insufficient documentation

## 2023-05-15 DIAGNOSIS — F419 Anxiety disorder, unspecified: Secondary | ICD-10-CM

## 2023-05-15 MED ORDER — KETOCONAZOLE 2 % EX SHAM: 1.0000 | MEDICATED_SHAMPOO | CUTANEOUS | 0 refills | Status: AC

## 2023-05-15 MED ORDER — CITALOPRAM HYDROBROMIDE 40 MG PO TABS: ORAL_TABLET | ORAL | 3 refills | Status: DC

## 2023-05-15 NOTE — Patient Instructions (Signed)
Try ketoconazole shampoo to your forehead and scalp.  Apply 1 application to the forehead and scalp, lather, leave on for at least 5 minutes, then rinse off.  Do this twice weekly for 2 to 4 weeks.  If the ketoconazole shampoo is too costly, try Selsun Blue shampoo for which you can buy at the drugstore, [employer], [employer], etc.  Apply the Selsun Blue shampoo 2-3 times weekly with the same directions as above.  It was a pleasure to see you today!

## 2023-05-15 NOTE — Assessment & Plan Note (Signed)
Controlled.  Continue citalopram 40 mg daily. Refills provided.

## 2023-05-15 NOTE — Assessment & Plan Note (Signed)
Exam today representative.  We discussed several options.  Prescription for ketoconazole 2% shampoo sent to pharmacy to use twice weekly.  Instructions for application discussed. If the ketoconazole shampoo is cost prohibitive, then try Selsun Blue shampoo OTC.  Instructions provided.  He will update.

## 2023-05-15 NOTE — Progress Notes (Signed)
Subjective:    Patient ID: Mike Rodriguez, male    DOB: 14-Mar-1996, 28 y.o.   MRN: [id number]  HPI  Mike Rodriguez is a very pleasant 28 y.o. male with a history of anxiety and depression who presents today for follow-up of chronic conditions and medication refill.  His mother joins [place] today.  1) Anxiety and Depression: Currently managed on citalopram 40 mg daily. Overall doing well on current regimen. He denies SI/HI, GI side effects. He is needing refills.   2) Rash: Chronic to the forehead for the last year. His rash is mostly with red splotches and scales. He denies his rash to the scalp, but he does have a lot of dandruff.   He denies itching, open wounds, bleeding, rashes elsewhere to his body.  He purchased an OTC cream at [employer], but has not used. Doesn't recall the name.    Review of Systems  Respiratory:  Negative for shortness of breath.   Cardiovascular:  Negative for chest pain.  Skin:  Positive for rash.  Psychiatric/Behavioral:  Negative for suicidal ideas. The patient is not nervous/anxious.          Past Medical History:  Diagnosis Date   Anxiety and depression    Palpitations 09/17/2017   Pulmonary embolism (HCC)     Social History   Socioeconomic History   Marital status: Single    Spouse name: Not on file   Number of children: Not on file   Years of education: Not on file   Highest education level: Not on file  Occupational History   Not on file  Tobacco Use   Smoking status: Never    Passive exposure: Never   Smokeless tobacco: Never  Vaping Use   Vaping status: Former  Substance and Sexual Activity   Alcohol use: Not on file   Drug use: Not on file   Sexual activity: Not on file  Other Topics Concern   Not on file  Social History Narrative   Single.   Currently not employed.   Enjoys playing computer games.    Social Drivers of Corporate investment banker Strain: Not on file  Food Insecurity: Not on file  Transportation Needs:  Not on file  Physical Activity: Not on file  Stress: Not on file  Social Connections: Not on file  Intimate Partner Violence: Not on file    Past Surgical History:  Procedure Laterality Date   APPENDECTOMY  2006    Family History  Problem Relation Age of Onset   Hypertension Mother    Kidney disease Mother    Arthritis Maternal Grandmother    Hyperlipidemia Maternal Grandmother    Hypertension Maternal Grandmother     No Known Allergies  Current Outpatient Medications on File Prior to Visit  Medication Sig Dispense Refill   diphenhydrAMINE (BENADRYL) 25 MG tablet Take 25 mg by mouth every 6 (six) hours as needed for allergies.     Multiple Vitamin (MULTIVITAMIN) tablet Take 1 tablet by mouth daily.     No current facility-administered medications on file prior to visit.    BP 108/62   Pulse 93   Temp 98.2 F (36.8 C) (Temporal)   Ht 5\' 11"  (1.803 m)   Wt 154 lb (69.9 kg)   SpO2 100%   BMI 21.48 kg/m  Objective:   Physical Exam Cardiovascular:     Rate and Rhythm: Normal rate and regular rhythm.  Pulmonary:     Effort: Pulmonary effort is normal.  Breath sounds: Normal breath sounds.  Musculoskeletal:     Cervical back: Neck supple.  Skin:    General: Skin is warm and dry.     Comments: Mildly erythematous, flat, scaly patches to bilateral forehead, scalp, postauricular region.  Neurological:     Mental Status: He is alert and oriented to person, place, and time.  Psychiatric:        Mood and Affect: Mood normal.           Assessment & Plan:  Seborrheic dermatitis of scalp Assessment & Plan: Exam today representative.  We discussed several options.  Prescription for ketoconazole 2% shampoo sent to pharmacy to use twice weekly.  Instructions for application discussed. If the ketoconazole shampoo is cost prohibitive, then try Selsun Blue shampoo OTC.  Instructions provided.  He will update.  Orders: -     Ketoconazole; Apply 1 Application  topically 2 (two) times a week. For 2-4 weeks.  Dispense: 120 mL; Refill: 0  Anxiety and depression Assessment & Plan: Controlled.  Continue citalopram 40 mg daily. Refills provided.  Orders: -     Citalopram Hydrobromide; TAKE 1 TABLET BY MOUTH DAILY. FOR ANXIETY AND DEPRESSION.  Dispense: 90 tablet; Refill: 3        Doreene Nest, NP

## 2024-03-17 ENCOUNTER — Other Ambulatory Visit: Payer: Self-pay

## 2024-03-17 ENCOUNTER — Ambulatory Visit (INDEPENDENT_AMBULATORY_CARE_PROVIDER_SITE_OTHER): Payer: Self-pay

## 2024-03-17 DIAGNOSIS — L219 Seborrheic dermatitis, unspecified: Secondary | ICD-10-CM

## 2024-03-17 MED ORDER — KETOCONAZOLE 2 % EX SHAM: MEDICATED_SHAMPOO | CUTANEOUS | 5 refills | Status: DC

## 2024-03-17 MED ORDER — KETOCONAZOLE 2 % EX CREA: TOPICAL_CREAM | CUTANEOUS | 5 refills | Status: AC

## 2024-03-17 MED ORDER — TACROLIMUS 0.1 % EX OINT: TOPICAL_OINTMENT | CUTANEOUS | 5 refills | Status: DC

## 2024-03-17 MED ORDER — TACROLIMUS 0.1 % EX OINT: TOPICAL_OINTMENT | CUTANEOUS | 5 refills | Status: AC

## 2024-03-17 MED ORDER — KETOCONAZOLE 2 % EX CREA: TOPICAL_CREAM | CUTANEOUS | 5 refills | Status: DC

## 2024-03-17 MED ORDER — CLOBETASOL PROPIONATE 0.05 % EX SOLN: CUTANEOUS | 5 refills | Status: DC

## 2024-03-17 MED ORDER — KETOCONAZOLE 2 % EX SHAM: MEDICATED_SHAMPOO | CUTANEOUS | 5 refills | Status: AC

## 2024-03-17 MED ORDER — CLOBETASOL PROPIONATE 0.05 % EX SOLN: CUTANEOUS | 5 refills | Status: AC

## 2024-03-17 NOTE — Progress Notes (Signed)
    Subjective   Mike Rodriguez is a 28 y.o. male who presents for the following: Lesion(s) of concern . Patient is new patient  Today patient reports: Patient has concerns of face being dry and itchy, has been occurring for about 2 years. Only uses charcoal wash once a week but does not use any other face products.  Patient was prescribed ketoconazole  shampoo and had no effect and stopped.   Review of Systems:    No other skin or systemic complaints except as noted in HPI or Assessment and Plan.  The following portions of the chart were reviewed this encounter and updated as appropriate: medications, allergies, medical history  Relevant Medical History:  Family history of skin cancer - BCC (grandma)   Objective  (SKPE) Well appearing patient in no apparent distress; mood and affect are within normal limits. Examination was performed of the: Focused Exam of: Face, back   Examination notable for: Seborrheic Dermatitis: Erythema and scaling of the scalp, ear canals, and central face > rest of face.  Examination limited by: Undergarments, Shoes or socks , and Clothing     Assessment & Plan  (SKAP)   Seborrheic dermatitis Chronic and persistent condition with duration or expected duration over one year. Condition is symptomatic and bothersome to patient. Patient is flaring and not currently at treatment goal.  - Discussed diagnosis, typical course, and treatment options for this condition - Explained to the patient the chronic nature of this diagnosis - Start ketoconazole  2% shampoo TIW, apply to scalp and affected areas of face/body and allow the shampoo to sit for 5-10 minutes before rinsing off - Consider alternating with use of Head and Shoulders or Selsun Blue anti-dandruff shampoo which contains zinc pyrithione 1% - Start ketoconazole  cream bid to affected areas   - Start clobetasol solution 0.05% twice daily to affected skin - Discussed side effect of super potent topical steroids  including atrophy, dyspigmentation, striae, telangectasia, folliculitis, loss of skin pigment, hair growth, tachyphylaxis, risk of systemic absorption with missuse.  Start tacrolimus ointment 0.1% twice daily on face Educated about black box warning (and also that recent studies show no increased malignancy risk) and common adverse effects such as burning with application (advised to cool in fridge and only apply to bone-dry skin). Given location of eruption, unsafe to use daily topical CS to area. Patient requires topical calcineurin inhibitor given high risk of dyspigmentation and atrophy from topical CS use in sensitive area.  Level of service outlined above   Patient instructions (SKPI)   Procedures, orders, diagnosis for this visit:    There are no diagnoses linked to this encounter.  Return to clinic: Return for 6-8 weeks, [facility], w/ Dr. Raymund.  I, Almetta Nora, RMA, am acting as scribe for Lauraine JAYSON Raymund, MD .   Documentation: I have reviewed the above documentation for accuracy and completeness, and I agree with the above.  Lauraine JAYSON Raymund, MD

## 2024-03-17 NOTE — Patient Instructions (Addendum)
 - Start ketoconazole  shampoo 2%  Advised to lather into hair and leave for 5-10 minutes before washing out   - Start ketoconazole  cream bid to affected areas    - Start clobetasol solution 0.05% twice daily to affected skin - Discussed side effect of super potent topical steroids including atrophy, dyspigmentation, striae, telangectasia, folliculitis, loss of skin pigment, hair growth, tachyphylaxis, risk of systemic absorption with missuse.   Start tacrolimus ointment 0.1% twice daily on face (safe to use on face)    Due to recent changes in healthcare laws, you may see results of your pathology and/or laboratory studies on MyChart before the doctors have had a chance to review them. We understand that in some cases there may be results that are confusing or concerning to you. Please understand that not all results are received at the same time and often the doctors may need to interpret multiple results in order to provide you with the best plan of care or course of treatment. Therefore, we ask that you please give us  2 business days to thoroughly review all your results before contacting the office for clarification. Should we see a critical lab result, you will be contacted sooner.   If You Need Anything After Your Visit  If you have any questions or concerns for your doctor, please call our main line at [phone] and press option 4 to reach your doctor's medical assistant. If no one answers, please leave a voicemail as directed and we will return your call as soon as possible. Messages left after 4 pm will be answered the following business day.   You may also send us  a message via MyChart. We typically respond to MyChart messages within 1-2 business days.  For prescription refills, please ask your pharmacy to contact our office. Our fax number is [phone].  If you have an urgent issue when the clinic is closed that cannot wait until the next business day, you can page your doctor  at the number below.    Please note that while we do our best to be available for urgent issues outside of office hours, we are not available 24/7.   If you have an urgent issue and are unable to reach us , you may choose to seek medical care at your doctor's office, retail clinic, urgent care center, or emergency room.  If you have a medical emergency, please immediately call 911 or go to the emergency department.  Pager Numbers  - Dr. Hester: [phone]  - Dr. Jackquline: [phone]  - Dr. Claudene: [phone]   - Dr. Raymund: [phone]  In the event of inclement weather, please call our main line at [phone] for an update on the status of any delays or closures.  Dermatology Medication Tips: Please keep the boxes that topical medications come in in order to help keep track of the instructions about where and how to use these. Pharmacies typically print the medication instructions only on the boxes and not directly on the medication tubes.   If your medication is too expensive, please contact our office at [phone] option 4 or send us  a message through MyChart.   We are unable to tell what your co-pay for medications will be in advance as this is different depending on your insurance coverage. However, we may be able to find a substitute medication at lower cost or fill out paperwork to get insurance to cover a needed medication.   If a prior authorization is required to get your medication covered  by your insurance company, please allow us  1-2 business days to complete this process.  Drug prices often vary depending on where the prescription is filled and some pharmacies may offer cheaper prices.  The website www.goodrx.com contains coupons for medications through different pharmacies. The prices here do not account for what the cost may be with help from insurance (it may be cheaper with your insurance), but the website can give you the price if you did not use any  insurance.  - You can print the associated coupon and take it with your prescription to the pharmacy.  - You may also stop by our office during regular business hours and pick up a GoodRx coupon card.  - If you need your prescription sent electronically to a different pharmacy, notify our office through [facility] or by phone at [phone] option 4.     Si Usted Necesita Algo Despus de Su Visita  Tambin puede enviarnos un mensaje a travs de Clinical Cytogeneticist. Por lo general respondemos a los mensajes de MyChart en el transcurso de 1 a 2 das hbiles.  Para renovar recetas, por favor pida a su farmacia que se ponga en contacto con nuestra oficina. Randi lakes de fax es [place] [phone].  Si tiene un asunto urgente cuando la clnica est cerrada y que no puede esperar hasta el siguiente da hbil, puede llamar/localizar a su doctor(a) al nmero que aparece a continuacin.   Por favor, tenga en cuenta que aunque hacemos todo lo posible para estar disponibles para asuntos urgentes fuera del horario de [place], no estamos disponibles las 24 horas del da, los 7 [address]  [address] de la [place].   Si tiene un problema urgente y no puede comunicarse con nosotros, puede optar por buscar atencin mdica  en el consultorio de su doctor(a), en una clnica privada, en un centro de atencin urgente o en una sala de emergencias.  Si tiene engineer, drilling, por favor llame inmediatamente al 911 o vaya a la sala de emergencias.  Nmeros de bper  - Dr. Hester: [phone]  - Dra. Jackquline: [phone]  - Dr. Claudene: [phone]  - Dra. Kitts: [phone]  En caso de inclemencias del [place], por favor llame a nuestra lnea principal al [phone] para una actualizacin sobre el estado de cualquier retraso o cierre.  Consejos para la medicacin en dermatologa: Por favor, guarde las cajas en las que vienen los medicamentos de uso tpico para ayudarle a seguir las instrucciones sobre dnde y cmo  usarlos. Las farmacias generalmente imprimen las instrucciones del medicamento slo en las cajas y no directamente en los tubos del [place].   Si su medicamento es muy caro, por favor, pngase en contacto con landry rieger llamando al [phone] y presione la opcin 4 o envenos un mensaje a travs de Clinical Cytogeneticist.   No podemos decirle cul ser su copago por los medicamentos por adelantado ya que esto es diferente dependiendo de la cobertura de su seguro. Sin embargo, es posible que podamos encontrar un medicamento sustituto a audiological scientist un formulario para que el seguro cubra el medicamento que se considera necesario.   Si se requiere una autorizacin previa para que su compaa de seguros cubra su medicamento, por favor permtanos de 1 a 2 das hbiles para completar este proceso.  Los precios de los medicamentos varan con frecuencia dependiendo del environmental consultant de dnde se surte la receta y alguna farmacias pueden ofrecer precios ms baratos.  El sitio web www.goodrx.com tiene cupones para medicamentos de [facility]  farmacias. Los precios aqu no tienen en cuenta lo que podra costar con la ayuda del seguro (puede ser ms barato con su seguro), pero el sitio web puede darle el precio si no utiliz tourist information centre manager.  - Puede imprimir el cupn correspondiente y llevarlo con su receta a la farmacia.  - Tambin puede pasar por nuestra oficina durante el horario de atencin regular y education officer, museum una tarjeta de cupones de GoodRx.  - Si necesita que su receta se enve electrnicamente a una farmacia diferente, informe a nuestra oficina a travs de MyChart de [place] o por telfono llamando al [phone] y presione la opcin 4.

## 2024-04-28 ENCOUNTER — Ambulatory Visit (INDEPENDENT_AMBULATORY_CARE_PROVIDER_SITE_OTHER): Payer: Self-pay

## 2024-04-28 DIAGNOSIS — L905 Scar conditions and fibrosis of skin: Secondary | ICD-10-CM

## 2024-04-28 DIAGNOSIS — L219 Seborrheic dermatitis, unspecified: Secondary | ICD-10-CM

## 2024-04-28 MED ORDER — ZORYVE 0.3 % EX FOAM: 2.0000 g | Freq: Every day | CUTANEOUS | 5 refills | Status: AC

## 2024-04-28 NOTE — Patient Instructions (Addendum)
 "    Your prescription was sent to [facility] in [place]. A representative from [facility] will contact you within 3 business hours to verify your address and insurance information to schedule a free delivery. If for any reason you do not receive a phone call from them, please reach out to them. Their phone number is [phone] and their hours are Monday-Friday 9:00 am-5:00 pm.    Medications Prescribed: - Start ketoconazole  shampoo 2% three times weekly Lather into hair and leave for 5-10 minutes before washing out This medication is also available in a cream if that is preferred  - Start clobetasol  solution 0.05% (topical steroid) daily to affected skin as needed for itch   For seborrheic dermatitis on the face - Can use ketoconazole  shampoo as face wash or use ketoconazole  cream - these are safe to use every day  1. Alternate over the counter dandruff shampoos. Examples include T-gel, T-sal, selsun blue, head and shoulders - Line up 2-3 shampoos in the shower, use one, move it to the back of the line, then the next time you shower use the next shampoo - Lather shampoo onto the scalp and allow to soak for for 5-10 min prior to rinsing - Do this all of the time, even when your dandruff is under control  2. Use the topical steroid   as needed when your scalp is itching and/or flaky/rashy  - stop once your scalp is under control - restart as needed     Seborrheic Dermatitis: Care Instructions Your Care Instructions Seborrheic dermatitis (say seh-buh-REE-ick der-muh-TY-tus) is a skin problem that causes a reddish rash with greasy, flaky, yellow skin patches. The rash may appear on many parts of the body. It may be on the scalp, face (especially the eyebrow area and between the nose and mouth), ears, breasts, underarms, and genital area. The flaky skin on the scalp is called dandruff. This rash is often a long-term (chronic) condition. It may last for years. But the  symptoms may come and go. Symptoms can be treated with special creams, shampoos, or other skin care. The cause of seborrheic dermatitis is not fully understood. It may occur when skin glands make too much oil. It may get worse in cold weather or with stress. A type of skin fungus, or yeast, may also be linked with this condition. Follow-up care is a key part of your treatment and safety. Be sure to make and go to all appointments, and call your doctor if you are having problems. It's also a good idea to know your test results and keep a list of the medicines you take. How can you care for yourself at home? If your doctor prescribes a steroid cream, dandruff shampoo, or antifungal cream or medicine, use it as directed. If your doctor prescribes other medicine, take it as directed. Use a dandruff shampoo if seborrheic dermatitis affects your scalp. This includes Head & Shoulders, Sebulex, and Selsun Blue. You may need to try a few kinds of shampoo to find the one that works best for you. To help with itching: Use hydrocortisone cream. Follow the directions on the label. Use cold, wet cloths. Take an over-the-counter antihistamine, such as diphenhydramine (Benadryl) or loratadine (Claritin). Read and follow all instructions on the label.  Where can you learn more? Go to [facility] at https://myuncchart.org Select Patient Education under [employer]. Enter [phone] in the search box to learn more about Seborrheic Dermatitis: Care Instructions. Current as of: October 15, 2018  2006-2020 [employer].  Care instructions adapted under license by [facility]. If you have questions about a medical condition or this instruction, always ask your healthcare professional. Healthwise, Incorporated disclaims any warranty or liability for your use of this information.      "

## 2024-04-28 NOTE — Progress Notes (Signed)
" °  °  Subjective   Mike Rodriguez is a 29 y.o. male who presents for the following: Follow up of seb derm. Patient is established patient   Today patient reports: Improvement in rash since last visit.   Review of Systems:    No other skin or systemic complaints except as noted in HPI or Assessment and Plan.  The following portions of the chart were reviewed this encounter and updated as appropriate: medications, allergies, medical history  Relevant Medical History:  n/a   Objective  (SKPE) Well appearing patient in no apparent distress; mood and affect are within normal limits. Examination was performed of the: Focused Exam of: scalp   Examination notable for: Seborrheic Dermatitis: Erythema and scaling of the scalp, ear canals, and central face > rest of face. - improving Acne scarring on face     Assessment & Plan  (SKAP)   Seborrheic dermatitis Chronic and persistent condition with duration or expected duration over one year. Condition is symptomatic and bothersome to patient. Patient is flaring and not currently at treatment goal.  - Discussed diagnosis, typical course, and treatment options for this condition - Explained to the patient the chronic nature of this diagnosis - Continue ketoconazole  2% shampoo TIW, apply to scalp and affected areas of face/body and allow the shampoo to sit for 5-10 minutes before rinsing off - Continue ketoconazole  cream bid to affected areas  - Consider alternating with use of Head and Shoulders or Selsun Blue anti-dandruff shampoo which contains zinc pyrithione 1% - Continue tacrolimus  ointment 0.1% twice daily on affected areas of the face Start roflumilast  foam 0.3% twice daily Given location of eruption, unsafe to use daily topical CS to area. Patient requires non- steroidal topical given high risk of dyspigmentation and atrophy from topical CS use in sensitive area.  Acne Scarring - face - Discussed cosmetic options- resurfacing lasers, peels  /out of pocket pricing - provided info for Dr Sherre, Dr Canda        Was sun protection counseling provided?: No   Level of service outlined above   Patient instructions (SKPI)   Procedures, orders, diagnosis for this visit:    There are no diagnoses linked to this encounter.  Return to clinic: Return if symptoms worsen or fail to improve.  I, Emerick Ege, CMA am acting as scribe for Lauraine JAYSON Kanaris, MD  Documentation: I have reviewed the above documentation for accuracy and completeness, and I agree with the above.  Lauraine JAYSON Kanaris, MD  "

## 2024-05-06 ENCOUNTER — Other Ambulatory Visit: Payer: Self-pay | Admitting: Primary Care

## 2024-05-06 DIAGNOSIS — F32A Depression, unspecified: Secondary | ICD-10-CM

## 2024-05-06 NOTE — Telephone Encounter (Signed)
 Patient is due for CPE/follow up In early February, this will be required prior to any further refills.  Please schedule, thank you!

## 2024-05-19 NOTE — Telephone Encounter (Signed)
 Called no voice mail
